# Patient Record
Sex: Female | Born: 1975 | Race: White | Hispanic: No | Marital: Married | Smoking: Former smoker
Health system: Southern US, Community
[De-identification: ages and names within clinical notes are randomized; demographics above are authoritative.]

## PROBLEM LIST (undated history)

## (undated) ENCOUNTER — Inpatient Hospital Stay (HOSPITAL_COMMUNITY): Payer: Self-pay

## (undated) ENCOUNTER — Inpatient Hospital Stay (HOSPITAL_COMMUNITY): Admission: RE | Payer: BC Managed Care – PPO | Source: Ambulatory Visit

## (undated) DIAGNOSIS — E785 Hyperlipidemia, unspecified: Secondary | ICD-10-CM

## (undated) DIAGNOSIS — N390 Urinary tract infection, site not specified: Secondary | ICD-10-CM

## (undated) DIAGNOSIS — R87619 Unspecified abnormal cytological findings in specimens from cervix uteri: Secondary | ICD-10-CM

## (undated) DIAGNOSIS — IMO0002 Reserved for concepts with insufficient information to code with codable children: Secondary | ICD-10-CM

## (undated) DIAGNOSIS — F329 Major depressive disorder, single episode, unspecified: Secondary | ICD-10-CM

## (undated) DIAGNOSIS — F32A Depression, unspecified: Secondary | ICD-10-CM

## (undated) DIAGNOSIS — N200 Calculus of kidney: Secondary | ICD-10-CM

## (undated) DIAGNOSIS — Z8619 Personal history of other infectious and parasitic diseases: Secondary | ICD-10-CM

## (undated) HISTORY — DX: Depression, unspecified: F32.A

## (undated) HISTORY — DX: Unspecified abnormal cytological findings in specimens from cervix uteri: R87.619

## (undated) HISTORY — DX: Urinary tract infection, site not specified: N39.0

## (undated) HISTORY — PX: KIDNEY STONE SURGERY: SHX686

## (undated) HISTORY — PX: HYSTERECTOMY ABDOMINAL WITH SALPINGECTOMY: SHX6725

## (undated) HISTORY — DX: Personal history of other infectious and parasitic diseases: Z86.19

## (undated) HISTORY — PX: DILATION AND CURETTAGE OF UTERUS: SHX78

## (undated) HISTORY — DX: Reserved for concepts with insufficient information to code with codable children: IMO0002

## (undated) HISTORY — DX: Hyperlipidemia, unspecified: E78.5

## (undated) HISTORY — DX: Calculus of kidney: N20.0

## (undated) HISTORY — PX: LEEP: SHX91

## (undated) HISTORY — DX: Major depressive disorder, single episode, unspecified: F32.9

---

## 1998-04-16 ENCOUNTER — Encounter: Admission: RE | Admit: 1998-04-16 | Discharge: 1998-04-16 | Payer: Self-pay | Admitting: Family Medicine

## 1999-05-25 ENCOUNTER — Emergency Department (HOSPITAL_COMMUNITY): Admission: EM | Admit: 1999-05-25 | Discharge: 1999-05-25 | Payer: Self-pay

## 2002-05-22 ENCOUNTER — Encounter: Payer: Self-pay | Admitting: Emergency Medicine

## 2002-05-22 ENCOUNTER — Emergency Department (HOSPITAL_COMMUNITY): Admission: EM | Admit: 2002-05-22 | Discharge: 2002-05-22 | Payer: Self-pay | Admitting: Emergency Medicine

## 2002-05-24 ENCOUNTER — Ambulatory Visit (HOSPITAL_BASED_OUTPATIENT_CLINIC_OR_DEPARTMENT_OTHER): Admission: RE | Admit: 2002-05-24 | Discharge: 2002-05-24 | Payer: Self-pay | Admitting: Urology

## 2002-08-16 ENCOUNTER — Ambulatory Visit (HOSPITAL_COMMUNITY): Admission: RE | Admit: 2002-08-16 | Discharge: 2002-08-16 | Payer: Self-pay | Admitting: Urology

## 2002-08-16 ENCOUNTER — Encounter: Payer: Self-pay | Admitting: Urology

## 2004-02-11 ENCOUNTER — Encounter: Payer: Self-pay | Admitting: Emergency Medicine

## 2004-02-11 ENCOUNTER — Ambulatory Visit (HOSPITAL_COMMUNITY): Admission: AD | Admit: 2004-02-11 | Discharge: 2004-02-11 | Payer: Self-pay | Admitting: Urology

## 2008-02-04 ENCOUNTER — Encounter: Payer: Self-pay | Admitting: Occupational Medicine

## 2008-02-04 ENCOUNTER — Ambulatory Visit: Payer: Self-pay | Admitting: Occupational Medicine

## 2009-02-04 ENCOUNTER — Encounter: Admission: RE | Admit: 2009-02-04 | Discharge: 2009-02-04 | Payer: Self-pay | Admitting: Obstetrics and Gynecology

## 2009-08-13 ENCOUNTER — Ambulatory Visit (HOSPITAL_COMMUNITY): Admission: RE | Admit: 2009-08-13 | Discharge: 2009-08-13 | Payer: Self-pay | Admitting: Obstetrics and Gynecology

## 2009-09-02 DEATH — deceased

## 2010-03-08 ENCOUNTER — Ambulatory Visit (HOSPITAL_COMMUNITY)
Admission: RE | Admit: 2010-03-08 | Discharge: 2010-03-08 | Payer: Self-pay | Source: Home / Self Care | Admitting: Obstetrics and Gynecology

## 2010-05-12 ENCOUNTER — Inpatient Hospital Stay (HOSPITAL_COMMUNITY)
Admission: AD | Admit: 2010-05-12 | Discharge: 2010-05-12 | Disposition: A | Discharge status: Home / Self Care | Payer: BC Managed Care – PPO | Source: Ambulatory Visit | Attending: Obstetrics and Gynecology | Admitting: Obstetrics and Gynecology

## 2010-05-12 DIAGNOSIS — O4700 False labor before 37 completed weeks of gestation, unspecified trimester: Secondary | ICD-10-CM | POA: Insufficient documentation

## 2010-06-22 LAB — ABO/RH: ABO/RH(D): A POS

## 2010-06-22 LAB — CBC
MCHC: 35.7 g/dL (ref 30.0–36.0)
RDW: 12.5 % (ref 11.5–15.5)

## 2010-07-06 ENCOUNTER — Inpatient Hospital Stay (HOSPITAL_COMMUNITY)
Admission: AD | Admit: 2010-07-06 | Discharge: 2010-07-06 | Disposition: A | Discharge status: Home / Self Care | Payer: BC Managed Care – PPO | Source: Ambulatory Visit | Attending: Obstetrics & Gynecology | Admitting: Obstetrics & Gynecology

## 2010-07-06 DIAGNOSIS — O4700 False labor before 37 completed weeks of gestation, unspecified trimester: Secondary | ICD-10-CM | POA: Insufficient documentation

## 2010-07-07 ENCOUNTER — Other Ambulatory Visit (HOSPITAL_COMMUNITY): Payer: Self-pay | Admitting: Obstetrics and Gynecology

## 2010-07-07 DIAGNOSIS — O409XX0 Polyhydramnios, unspecified trimester, not applicable or unspecified: Secondary | ICD-10-CM

## 2010-07-13 ENCOUNTER — Ambulatory Visit (HOSPITAL_COMMUNITY)
Admission: RE | Admit: 2010-07-13 | Discharge: 2010-07-13 | Disposition: A | Discharge status: Home / Self Care | Payer: BC Managed Care – PPO | Source: Ambulatory Visit | Attending: Obstetrics and Gynecology | Admitting: Obstetrics and Gynecology

## 2010-07-13 ENCOUNTER — Other Ambulatory Visit (HOSPITAL_COMMUNITY): Payer: Self-pay | Admitting: Obstetrics and Gynecology

## 2010-07-13 DIAGNOSIS — O409XX0 Polyhydramnios, unspecified trimester, not applicable or unspecified: Secondary | ICD-10-CM

## 2010-07-13 DIAGNOSIS — Z1389 Encounter for screening for other disorder: Secondary | ICD-10-CM | POA: Insufficient documentation

## 2010-07-13 DIAGNOSIS — O3660X0 Maternal care for excessive fetal growth, unspecified trimester, not applicable or unspecified: Secondary | ICD-10-CM | POA: Insufficient documentation

## 2010-07-13 DIAGNOSIS — O358XX0 Maternal care for other (suspected) fetal abnormality and damage, not applicable or unspecified: Secondary | ICD-10-CM | POA: Insufficient documentation

## 2010-07-13 DIAGNOSIS — Z363 Encounter for antenatal screening for malformations: Secondary | ICD-10-CM | POA: Insufficient documentation

## 2010-07-25 ENCOUNTER — Inpatient Hospital Stay (HOSPITAL_COMMUNITY)
Admission: AD | Admit: 2010-07-25 | Discharge: 2010-07-25 | Disposition: A | Discharge status: Home / Self Care | Payer: BC Managed Care – PPO | Source: Ambulatory Visit | Attending: Obstetrics and Gynecology | Admitting: Obstetrics and Gynecology

## 2010-07-25 ENCOUNTER — Inpatient Hospital Stay (HOSPITAL_COMMUNITY): Payer: BC Managed Care – PPO

## 2010-07-25 DIAGNOSIS — O99891 Other specified diseases and conditions complicating pregnancy: Secondary | ICD-10-CM | POA: Insufficient documentation

## 2010-07-25 DIAGNOSIS — O479 False labor, unspecified: Secondary | ICD-10-CM | POA: Insufficient documentation

## 2010-07-27 ENCOUNTER — Inpatient Hospital Stay (HOSPITAL_COMMUNITY)
Admission: RE | Admit: 2010-07-27 | Discharge: 2010-07-29 | DRG: 373 | Disposition: A | Discharge status: Home / Self Care | Payer: BC Managed Care – PPO | Source: Ambulatory Visit | Attending: Obstetrics & Gynecology | Admitting: Obstetrics & Gynecology

## 2010-07-27 DIAGNOSIS — O409XX0 Polyhydramnios, unspecified trimester, not applicable or unspecified: Principal | ICD-10-CM | POA: Diagnosis present

## 2010-07-27 LAB — CBC
MCV: 90.6 fL (ref 78.0–100.0)
Platelets: 164 10*3/uL (ref 150–400)
RDW: 15.8 % — ABNORMAL HIGH (ref 11.5–15.5)
WBC: 8.9 10*3/uL (ref 4.0–10.5)

## 2010-07-28 LAB — CBC
MCH: 29.4 pg (ref 26.0–34.0)
Platelets: 157 10*3/uL (ref 150–400)
RBC: 3.71 MIL/uL — ABNORMAL LOW (ref 3.87–5.11)
RDW: 15.7 % — ABNORMAL HIGH (ref 11.5–15.5)
WBC: 12.3 10*3/uL — ABNORMAL HIGH (ref 4.0–10.5)

## 2010-08-20 NOTE — Op Note (Signed)
NAMEDEBORRAH, [name] NO.:  [id number]   MEDICAL RECORD NO.:  [id number]          PATIENT TYPE:  AMB   LOCATION:  DAY                          FACILITY:  [facility]   PHYSICIAN:  Jamison Neighbor, M.D.  DATE OF BIRTH:  1975/08/26   DATE OF PROCEDURE:  02/11/2004  DATE OF DISCHARGE:                                 OPERATIVE REPORT   SERVICE:  Urology.   PREOPERATIVE DIAGNOSES:  Left ureteral calculus.   POSTOPERATIVE DIAGNOSES:  Left ureteral calculus.   PROCEDURE:  Cystoscopy, left retrograde left ureteroscopy, left basket  extraction.   SURGEON:  Jamison Neighbor, M.D.   ANESTHESIA:  General.   COMPLICATIONS:  None.   DRAINS:  None.   BRIEF HISTORY:  This patient has had multiple stones in the past.  She has  primarily had problems on the right hand side.  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  The patient now has  left sided pain and CT scan showed that there was a small stone. The patient  felt the pain was still intractable and she required so much medication that  she wished to have something done about this. She is now to undergo stent  placement with possible ureteroscopy.  The patient understands the risks and  benefits of the procedure and gave full and informed consent.   DESCRIPTION OF PROCEDURE:  After successful induction of general anesthesia,  the patient was placed in the dorsal lithotomy position, prepped with  Betadine and draped in the usual sterile fashion.  Cystoscopy was performed,  the bladder was carefully inspected and was [place] of any tumor or stones.  Both ureteral orifices were normal in configuration and location. Retrograde  study performed on the left hand side showed moderately dilated ureter with  possible stone in the distal left ureter. The patient underwent  ureteroscopic examination where a stone could be seen, this was extracted  under direct vision.  The remainder of the ureter  was completely normal.  This was such an atraumatic ureteroscopy with no need for dilation and it  was felt that a stent was not required.  The guidewire was removed, the  patient tolerated the procedure well and was taken to the recovery room in  good condition. She will be sent home with Tylox and will return to see [place]  in followup.     RJE/MEDQ  D:  02/11/2004  T:  02/11/2004  Job:  [id number]

## 2010-08-20 NOTE — Op Note (Signed)
[name], [name]                            ACCOUNT NO.:  [id number]   MEDICAL RECORD NO.:  [id number]                   PATIENT TYPE:  AMB   LOCATION:  [facility]                                 FACILITY:  [facility]   PHYSICIAN:  Jamison Neighbor, M.D.               DATE OF BIRTH:  02/22/1976   DATE OF PROCEDURE:  05/24/2002  DATE OF DISCHARGE:                                 OPERATIVE REPORT   PREOPERATIVE DIAGNOSIS:  Right hydronephrosis.   POSTOPERATIVE DIAGNOSIS:  Right hydronephrosis.   PROCEDURES:  1. Cystoscopy.  2. Bilateral retrograde pyelography.  3. Right ureteroscopy with balloon dilation of ureter.  4. Right ureteral stent placement.   SURGEON:  Jamison Neighbor, M.D.   ASSISTANT:  Crecencio Mc, M.D.   ANESTHESIA:  General.   COMPLICATIONS:  None.   INDICATIONS FOR PROCEDURE:  The patient is a 35 year-old white female with  recent onset of right sided abdominal and flank pain.  She is status post a  right ureteroscopic stone procedure a couple of years ago at an outside  hospital.  The patient recently presented with her symptoms and a CT scan  was performed.  This demonstrated a dilated renal pelvis with dilation of  the ureter down to the pelvis.  In addition, there was a large calcification  near the area where the dilation appeared to disappear.  This did appear to  be outside the ureter consistent with a possible appendicolith.  However,  due to the patient's symptoms and dilated system, it was decided to perform  retrograde pyelography for further evaluation with ureteroscopy.  The  potential risks and benefits of this procedure were explained to the patient  and she consented.   DESCRIPTION OF PROCEDURE:  The patient was taken to the operating room and  general anesthetic was administered.  The patient was given preoperative  antibiotics, placed in the dorsal lithotomy position and prepped and draped  in the usual sterile fashion.  Next, cystoscopy was  performed within a 12  degree lens.  The entire bladder was able to be examined and demonstrated  bilateral ureteral orifices in the normal anatomic position and effluxing  clear urine. There was no evidence of any tumors, stones or other mucosal  pathology.  There was only a small amount of squamous metaplasia over the  trigone. Attention was then turned to the right ureteral orifice. A  retrograde pyelogram was performed.  This demonstrated a dilated ureter from  the bladder all the way up to a dilated renal pelvis.  There were two areas  in the ureter that appeared kinked.  One of these was just above the pelvic  brim. There was another area just below the ureteropelvic junction. Although  there was a large dilated renal pelvis, the calices did not appear blunted  and dilated.  At this point, a 0.038 guide wire was placed up through  the 6  [place] Inoue catheter.  There was some difficulty getting the wire to  traverse the upper kink in the ureter.  Therefore, the Inoue catheter was  advanced up over this wire and the wire was then able to be placed into the  renal pelvis without problems.  Ureteroscopy was performed with the semi-  rigid ureteroscope and demonstrated no intraluminal pathology or clear areas  of intraluminal obstruction.  The ureteroscope was then removed and replaced  with the cystoscope which was back loaded over the guide wire.  Balloon  dilation was then performed first of the upper ureter and then of the mid  and lower ureter.  A 7 x 0.6 double J ureteral stent was then placed over  the guide wire using Seldinger technique.  A good curl was noted up in the  renal pelvis as well as in the bladder following removal of the wire.  The  patient's bladder was then emptied and the procedure was ended. She was able  to be awakened and transferred to the recovery unit in satisfactory  condition.  Please note that Dr. Marcelyn Bruins was the operating surgeon and  was present and  participated in the entire procedure.     Crecencio Mc, M.D.                          Jamison Neighbor, M.D.    LB/MEDQ  D:  05/24/2002  T:  05/24/2002  Job:  [id number]   cc:   Jamison Neighbor, M.D.  [address]  [place]  [place] [address]  Fax: [phone]

## 2011-09-30 LAB — OB RESULTS CONSOLE ABO/RH: RH Type: POSITIVE

## 2011-09-30 LAB — OB RESULTS CONSOLE HEPATITIS B SURFACE ANTIGEN: Hepatitis B Surface Ag: NEGATIVE

## 2011-09-30 LAB — OB RESULTS CONSOLE ANTIBODY SCREEN: Antibody Screen: NEGATIVE

## 2011-09-30 LAB — OB RESULTS CONSOLE RUBELLA ANTIBODY, IGM: Rubella: IMMUNE

## 2012-02-17 ENCOUNTER — Inpatient Hospital Stay (HOSPITAL_COMMUNITY)
Admission: AD | Admit: 2012-02-17 | Discharge: 2012-02-17 | Disposition: A | Discharge status: Home / Self Care | Payer: BC Managed Care – PPO | Source: Ambulatory Visit | Attending: Obstetrics and Gynecology | Admitting: Obstetrics and Gynecology

## 2012-02-17 ENCOUNTER — Encounter (HOSPITAL_COMMUNITY): Payer: Self-pay | Admitting: *Deleted

## 2012-02-17 DIAGNOSIS — Y92410 Unspecified street and highway as the place of occurrence of the external cause: Secondary | ICD-10-CM | POA: Diagnosis not present

## 2012-02-17 DIAGNOSIS — O99891 Other specified diseases and conditions complicating pregnancy: Secondary | ICD-10-CM | POA: Insufficient documentation

## 2012-02-17 DIAGNOSIS — M549 Dorsalgia, unspecified: Secondary | ICD-10-CM | POA: Diagnosis present
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
## 2012-03-25 ENCOUNTER — Inpatient Hospital Stay (HOSPITAL_COMMUNITY)
Admission: AD | Admit: 2012-03-25 | Discharge: 2012-03-25 | Disposition: A | Discharge status: Home / Self Care | Payer: BC Managed Care – PPO | Source: Ambulatory Visit | Attending: Obstetrics and Gynecology | Admitting: Obstetrics and Gynecology

## 2012-03-25 ENCOUNTER — Encounter (HOSPITAL_COMMUNITY): Payer: Self-pay | Admitting: Obstetrics and Gynecology

## 2012-03-25 DIAGNOSIS — O4700 False labor before 37 completed weeks of gestation, unspecified trimester: Secondary | ICD-10-CM | POA: Insufficient documentation

## 2012-03-25 LAB — URINALYSIS, ROUTINE W REFLEX MICROSCOPIC
Ketones, ur: 15 mg/dL — AB
Nitrite: NEGATIVE
Urobilinogen, UA: 0.2 mg/dL (ref 0.0–1.0)
pH: 6 (ref 5.0–8.0)

## 2012-03-25 LAB — CBC
HCT: 34.3 % — ABNORMAL LOW (ref 36.0–46.0)
Hemoglobin: 11.6 g/dL — ABNORMAL LOW (ref 12.0–15.0)
RBC: 3.82 MIL/uL — ABNORMAL LOW (ref 3.87–5.11)
WBC: 11 10*3/uL — ABNORMAL HIGH (ref 4.0–10.5)

## 2012-03-25 LAB — WET PREP, GENITAL: Trich, Wet Prep: NONE SEEN

## 2012-03-25 LAB — URINE MICROSCOPIC-ADD ON

## 2012-03-25 MED ORDER — LACTATED RINGERS IV BOLUS (SEPSIS): 1000.0000 mL | Freq: Once | INTRAVENOUS | Status: DC

## 2012-03-25 MED ORDER — BETAMETHASONE SOD PHOS & ACET 6 (3-3) MG/ML IJ SUSP
12.0000 mg | Freq: Once | INTRAMUSCULAR | Status: AC
  Administered 2012-03-25: 12 mg via INTRAMUSCULAR
  Filled 2012-03-25: qty 2

## 2012-03-25 MED ORDER — NIFEDIPINE 10 MG PO CAPS
20.0000 mg | ORAL_CAPSULE | Freq: Once | ORAL | Status: AC
  Administered 2012-03-25: 20 mg via ORAL
  Filled 2012-03-25: qty 2

## 2012-03-25 MED ORDER — NIFEDIPINE 10 MG PO CAPS: 10.0000 mg | ORAL_CAPSULE | Freq: Three times a day (TID) | ORAL | Status: DC

## 2012-03-25 NOTE — MAU Note (Signed)
Pt reports having ctx on and off since 1040. Reports having good fetal movement denies Vag bleeding or leaking.

## 2012-03-25 NOTE — ED Provider Notes (Signed)
Pt is a 36 year old white female [phone] who presents to the ER complaining of increased cramping and lower abd pressure since this am. She states that she had an episode of diarrhea this am and "just did not feel right". Early this evening she had an increase in contractions. In the ER she was contracting every two to three minutes. Her cervix was 20/1/high. She had a FFN obtained which was negative. She was given Procardia and IVFs. She was also given Betamethasone. She had a normal WBC, After the therapy she noted less pressure and could not feel any cramping. Plan/ Will discharge to home with bedrest.          Will return tomorrow for the second Betamethasone          Will use procardia 10mg  QID.

## 2012-03-25 NOTE — MAU Provider Note (Signed)
Chief Complaint:  Contractions   First Provider Initiated Contact with Patient 03/25/12 1543      HPI: [name] is a 36 y.o. [id number] at [redacted]w[redacted]d who presents to maternity admissions reporting contractions. The patient states that contractions started this morning and were irregular and now have been occuring approximately every 5 minutes x 1 hour. The patient states that she does have some vaginal discharge but there is not change from previous. She denies LOF, vaginal bleeding. She reports good fetal movement. She denies any urinary symptoms and states that she has had good PO intake today. She does report 1 episode of loose stool last night that has since resolved.   Pregnancy Course:   Past Medical History: Past Medical History  Diagnosis Date  . No pertinent past medical history     Past obstetric history:     OB History    Grav Para Term Preterm Abortions TAB SAB Ect Mult Living   4 2 2  1  1   2      # Outc Date GA Lbr Len/2nd Wgt Sex Del Anes PTL Lv   1 TRM            2 TRM            3 SAB            4 CUR               Past Surgical History: Past Surgical History  Procedure Date  . Kidney stone surgery     Family History: History reviewed. No pertinent family history.  Social History: History  Substance Use Topics  . Smoking status: Never Smoker   . Smokeless tobacco: Not on file  . Alcohol Use: No    Allergies:  Allergies  Allergen Reactions  . Codeine Sulfate Other (See Comments)    REACTION: hallucinations     Meds:  Prescriptions prior to admission  Medication Sig Dispense Refill  . Prenatal Vit-Fe Fumarate-FA (PRENATAL MULTIVITAMIN) TABS Take 1 tablet by mouth daily.        ROS: Pertinent findings in history of present illness.  Physical Exam  Blood pressure 109/65, pulse 119, temperature 98 F (36.7 C), temperature source Oral, resp. rate 18, height 5\' 5"  (1.651 m), weight 169 lb 3.2 oz (76.749 kg). GENERAL: Well-developed, well-nourished  female in no acute distress.  HEENT: normocephalic HEART: normal rate RESP: normal effort ABDOMEN: Soft, non-tender, gravid appropriate for gestational age EXTREMITIES: Nontender, no edema NEURO: alert and oriented SPECULUM EXAM: NEFG, physiologic discharge, no blood, cervix clean    FHT:  Baseline 160 , moderate variability, accelerations present, no decelerations Contractions: q 3-4 mins   Labs: Results for orders placed during the hospital encounter of 03/25/12 (from the past 24 hour(s))  URINALYSIS, ROUTINE W REFLEX MICROSCOPIC     Status: Abnormal   Collection Time   03/25/12  3:15 PM      Component Value Range   Color, Urine YELLOW  YELLOW   APPearance CLEAR  CLEAR   Specific Gravity, Urine 1.010  1.005 - 1.030   pH 6.0  5.0 - 8.0   Glucose, UA NEGATIVE  NEGATIVE mg/dL   Hgb urine dipstick NEGATIVE  NEGATIVE   Bilirubin Urine NEGATIVE  NEGATIVE   Ketones, ur 15 (*) NEGATIVE mg/dL   Protein, ur NEGATIVE  NEGATIVE mg/dL   Urobilinogen, UA 0.2  0.0 - 1.0 mg/dL   Nitrite NEGATIVE  NEGATIVE   Leukocytes, UA SMALL (*) NEGATIVE  URINE MICROSCOPIC-ADD ON     Status: Abnormal   Collection Time   03/25/12  3:15 PM      Component Value Range   Squamous Epithelial / LPF FEW (*) RARE   WBC, UA 3-6  <3 WBC/hpf   RBC / HPF 0-2  <3 RBC/hpf   Bacteria, UA FEW (*) RARE   Urine-Other RARE YEAST      Imaging:  No results found.  MAU Course: Discussed patient with Dr. Dareen Piano. He would like patient to have procardia and betamethasone now and continue to monitor for contractions Procardia 20 mg given. Patient continues to contract every 3-4 minutes. Contractions are subjectively worse.  Dr. Dareen Piano came to evaluate the patient.  IV started and given LR bolus.   Assessment: Preterm contractions  Plan: Dr. Dareen Piano to MAU to evaluate patient. Care of patient handed over to Dr. Dareen Piano.     Medication List     As of 03/25/2012  3:58 PM    ASK your doctor about these  medications         prenatal multivitamin Tabs   Take 1 tablet by mouth daily.          Freddi Starr, PA-C 03/25/2012 3:58 PM

## 2012-03-25 NOTE — MAU Note (Signed)
Pt presents to MAU with chief complaint of contractions. Pt is [redacted]w[redacted]d- says she was at church this morning and contractions started. Pt called Dr. Dareen Piano and he told her to rest and come to MAU if contractions did not stop. Pt says the contractions have not stopped since this morning.

## 2012-03-26 ENCOUNTER — Inpatient Hospital Stay (HOSPITAL_COMMUNITY)
Admission: AD | Admit: 2012-03-26 | Discharge: 2012-03-26 | Disposition: A | Discharge status: Home / Self Care | Payer: BC Managed Care – PPO | Source: Ambulatory Visit | Attending: Obstetrics & Gynecology | Admitting: Obstetrics & Gynecology

## 2012-03-26 DIAGNOSIS — O4700 False labor before 37 completed weeks of gestation, unspecified trimester: Secondary | ICD-10-CM | POA: Insufficient documentation

## 2012-03-26 LAB — GC/CHLAMYDIA PROBE AMP
CT Probe RNA: NEGATIVE
GC Probe RNA: NEGATIVE

## 2012-03-26 MED ORDER — BETAMETHASONE SOD PHOS & ACET 6 (3-3) MG/ML IJ SUSP
12.0000 mg | Freq: Once | INTRAMUSCULAR | Status: AC
  Administered 2012-03-26: 12 mg via INTRAMUSCULAR
  Filled 2012-03-26: qty 2

## 2012-03-27 LAB — URINE CULTURE
Colony Count: NO GROWTH
Culture: NO GROWTH
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
## 2012-04-11 ENCOUNTER — Encounter (HOSPITAL_COMMUNITY): Payer: Self-pay | Admitting: *Deleted

## 2012-04-11 ENCOUNTER — Inpatient Hospital Stay (HOSPITAL_COMMUNITY)
Admission: AD | Admit: 2012-04-11 | Discharge: 2012-04-11 | Disposition: A | Discharge status: Home / Self Care | Payer: BC Managed Care – PPO | Source: Ambulatory Visit | Attending: Obstetrics and Gynecology | Admitting: Obstetrics and Gynecology

## 2012-04-11 DIAGNOSIS — O4700 False labor before 37 completed weeks of gestation, unspecified trimester: Secondary | ICD-10-CM | POA: Insufficient documentation

## 2012-04-11 LAB — CBC
MCH: 30.7 pg (ref 26.0–34.0)
MCHC: 34.6 g/dL (ref 30.0–36.0)
MCV: 88.8 fL (ref 78.0–100.0)
Platelets: 214 10*3/uL (ref 150–400)
RBC: 4.01 MIL/uL (ref 3.87–5.11)

## 2012-04-11 LAB — URINALYSIS, ROUTINE W REFLEX MICROSCOPIC
Bilirubin Urine: NEGATIVE
Ketones, ur: NEGATIVE mg/dL
Nitrite: NEGATIVE
Specific Gravity, Urine: 1.02 (ref 1.005–1.030)
Urobilinogen, UA: 0.2 mg/dL (ref 0.0–1.0)

## 2012-04-11 LAB — COMPREHENSIVE METABOLIC PANEL
AST: 12 U/L (ref 0–37)
CO2: 20 mEq/L (ref 19–32)
Calcium: 9 mg/dL (ref 8.4–10.5)
Creatinine, Ser: 0.61 mg/dL (ref 0.50–1.10)
GFR calc Af Amer: >90 mL/min (ref >90)
GFR calc non Af Amer: >90 mL/min (ref >90)
Total Protein: 6.4 g/dL (ref 6.0–8.3)

## 2012-04-11 NOTE — Progress Notes (Signed)
Pt states it depends, but with the worse contractions pain is about a 6

## 2012-04-11 NOTE — MAU Provider Note (Signed)
History     CSN: [id number]  Arrival date and time: 04/11/12 1659   None     Chief Complaint  Patient presents with  . Contractions    Pt sent over from the doctors office for further evaluation   HPI This is a 37 y.o. female at [redacted]w[redacted]d who presents from office with contractions. Also had been having some nausea which she believes was related to her low back pain, but is now gone. Denies congestion or sore throat. Has painful contractions which she reports as a "6".  Denies leaking or bleeding. Dr Tenny Craw saw her in the office and sent her over for monitoring, labs and a flu test.   OB History    Grav Para Term Preterm Abortions TAB SAB Ect Mult Living   4 2 2  1  1   2       Past Medical History  Diagnosis Date  . No pertinent past medical history     Past Surgical History  Procedure Date  . Kidney stone surgery     Family History  Problem Relation Age of Onset  . Diabetes Father     History  Substance Use Topics  . Smoking status: Never Smoker   . Smokeless tobacco: Not on file  . Alcohol Use: No    Allergies:  Allergies  Allergen Reactions  . Codeine Sulfate Other (See Comments)    REACTION: hallucinations     Prescriptions prior to admission  Medication Sig Dispense Refill  . Prenatal Vit-Fe Fumarate-FA (PRENATAL MULTIVITAMIN) TABS Take 1 tablet by mouth daily.        Review of Systems  Constitutional: Negative for fever, chills and malaise/fatigue.  HENT: Negative for congestion and sore throat.   Eyes: Negative for blurred vision.  Respiratory: Negative for cough and shortness of breath.   Cardiovascular: Negative for chest pain.  Gastrointestinal: Positive for abdominal pain (with contractions). Negative for heartburn, nausea, vomiting, diarrhea and constipation.  Genitourinary: Negative for dysuria.  Musculoskeletal: Negative for myalgias.  Neurological: Negative for weakness and headaches.   Physical Exam   Blood pressure 110/71, pulse 112,  temperature 97.9 F (36.6 C), temperature source Oral, resp. rate 18.  Physical Exam  Constitutional: She is oriented to person, place, and time. She appears well-developed and well-nourished. No distress.  HENT:  Head: Normocephalic.  Cardiovascular: Normal rate, regular rhythm and normal heart sounds.   No murmur heard. Respiratory: Effort normal and breath sounds normal. No respiratory distress. She has no wheezes. She has no rales. She exhibits no tenderness.  GI: Soft. She exhibits no distension and no mass. There is no tenderness. There is no rebound and no guarding.  Musculoskeletal: Normal range of motion. She exhibits no edema.  Neurological: She is alert and oriented to person, place, and time.  Skin: Skin is warm and dry. She is not diaphoretic.  Psychiatric: She has a normal mood and affect.   FHR baseline 140-150 with accels to 160-170 UCs every 3-4 minutes.  Dilation: 3 Effacement (%): 50 Cervical Position: Middle Station: -2 Presentation: Vertex Exam by:: B.Bethea RN  Results for orders placed during the hospital encounter of 04/11/12 (from the past 24 hour(s))  URINALYSIS, ROUTINE W REFLEX MICROSCOPIC     Status: Abnormal   Collection Time   04/11/12  5:00 PM      Component Value Range   Color, Urine YELLOW  YELLOW   APPearance CLEAR  CLEAR   Specific Gravity, Urine 1.020  1.005 - 1.030  pH 6.0  5.0 - 8.0   Glucose, UA NEGATIVE  NEGATIVE mg/dL   Hgb urine dipstick TRACE (*) NEGATIVE   Bilirubin Urine NEGATIVE  NEGATIVE   Ketones, ur NEGATIVE  NEGATIVE mg/dL   Protein, ur NEGATIVE  NEGATIVE mg/dL   Urobilinogen, UA 0.2  0.0 - 1.0 mg/dL   Nitrite NEGATIVE  NEGATIVE   Leukocytes, UA TRACE (*) NEGATIVE  URINE MICROSCOPIC-ADD ON     Status: Abnormal   Collection Time   04/11/12  5:00 PM      Component Value Range   Squamous Epithelial / LPF MANY (*) RARE   WBC, UA 7-10  <3 WBC/hpf   RBC / HPF 3-6  <3 RBC/hpf   Bacteria, UA MANY (*) RARE   Urine-Other MUCOUS  PRESENT    CBC     Status: Abnormal   Collection Time   04/11/12  5:23 PM      Component Value Range   WBC 11.3 (*) 4.0 - 10.5 K/uL   RBC 4.01  3.87 - 5.11 MIL/uL   Hemoglobin 12.3  12.0 - 15.0 g/dL   HCT 57.8 (*) 46.9 - 62.9 %   MCV 88.8  78.0 - 100.0 fL   MCH 30.7  26.0 - 34.0 pg   MCHC 34.6  30.0 - 36.0 g/dL   RDW 52.8  41.3 - 24.4 %   Platelets 214  150 - 400 K/uL  COMPREHENSIVE METABOLIC PANEL     Status: Abnormal   Collection Time   04/11/12  5:23 PM      Component Value Range   Sodium 134 (*) 135 - 145 mEq/L   Potassium 3.7  3.5 - 5.1 mEq/L   Chloride 101  96 - 112 mEq/L   CO2 20  19 - 32 mEq/L   Glucose, Bld 80  70 - 99 mg/dL   BUN 6  6 - 23 mg/dL   Creatinine, Ser 0.10  0.50 - 1.10 mg/dL   Calcium 9.0  8.4 - 27.2 mg/dL   Total Protein 6.4  6.0 - 8.3 g/dL   Albumin 2.8 (*) 3.5 - 5.2 g/dL   AST 12  0 - 37 U/L   ALT 8  0 - 35 U/L   Alkaline Phosphatase 95  39 - 117 U/L   Total Bilirubin 0.3  0.3 - 1.2 mg/dL   GFR calc non Af Amer >90  >90 mL/min   GFR calc Af Amer >90  >90 mL/min   Urine to culture per lab protocol  MAU Course  Procedures  MDM Discussed with Dr Dareen Piano.  Will call patient if flu test comes back positive.  Assessment and Plan  A:  SIUP at [redacted]w[redacted]d       Preterm contractions without cervical change P:  Discharge home      Supportive care      Labor precautions reviewed. Encouraged to call if symptoms recur or if contractions get stronger, leaking starts or has bleeding or decreased fetal movements.    [facility] 04/11/2012, 6:35 PM   After visit: Results for orders placed during the hospital encounter of 04/11/12 (from the past 24 hour(s))  URINALYSIS, ROUTINE W REFLEX MICROSCOPIC     Status: Abnormal   Collection Time   04/11/12  5:00 PM      Component Value Range   Color, Urine YELLOW  YELLOW   APPearance CLEAR  CLEAR   Specific Gravity, Urine 1.020  1.005 - 1.030   pH 6.0  5.0 - 8.0  Glucose, UA NEGATIVE  NEGATIVE mg/dL   Hgb urine  dipstick TRACE (*) NEGATIVE   Bilirubin Urine NEGATIVE  NEGATIVE   Ketones, ur NEGATIVE  NEGATIVE mg/dL   Protein, ur NEGATIVE  NEGATIVE mg/dL   Urobilinogen, UA 0.2  0.0 - 1.0 mg/dL   Nitrite NEGATIVE  NEGATIVE   Leukocytes, UA TRACE (*) NEGATIVE  URINE MICROSCOPIC-ADD ON     Status: Abnormal   Collection Time   04/11/12  5:00 PM      Component Value Range   Squamous Epithelial / LPF MANY (*) RARE   WBC, UA 7-10  <3 WBC/hpf   RBC / HPF 3-6  <3 RBC/hpf   Bacteria, UA MANY (*) RARE   Urine-Other MUCOUS PRESENT    T4     Status: Abnormal   Collection Time   04/11/12  5:23 PM      Component Value Range   T4, Total 14.0 (*) 5.0 - 12.5 ug/dL  TSH     Status: Normal   Collection Time   04/11/12  5:23 PM      Component Value Range   TSH 2.844  0.350 - 4.500 uIU/mL  CBC     Status: Abnormal   Collection Time   04/11/12  5:23 PM      Component Value Range   WBC 11.3 (*) 4.0 - 10.5 K/uL   RBC 4.01  3.87 - 5.11 MIL/uL   Hemoglobin 12.3  12.0 - 15.0 g/dL   HCT 16.1 (*) 09.6 - 04.5 %   MCV 88.8  78.0 - 100.0 fL   MCH 30.7  26.0 - 34.0 pg   MCHC 34.6  30.0 - 36.0 g/dL   RDW 40.9  81.1 - 91.4 %   Platelets 214  150 - 400 K/uL  COMPREHENSIVE METABOLIC PANEL     Status: Abnormal   Collection Time   04/11/12  5:23 PM      Component Value Range   Sodium 134 (*) 135 - 145 mEq/L   Potassium 3.7  3.5 - 5.1 mEq/L   Chloride 101  96 - 112 mEq/L   CO2 20  19 - 32 mEq/L   Glucose, Bld 80  70 - 99 mg/dL   BUN 6  6 - 23 mg/dL   Creatinine, Ser 7.82  0.50 - 1.10 mg/dL   Calcium 9.0  8.4 - 95.6 mg/dL   Total Protein 6.4  6.0 - 8.3 g/dL   Albumin 2.8 (*) 3.5 - 5.2 g/dL   AST 12  0 - 37 U/L   ALT 8  0 - 35 U/L   Alkaline Phosphatase 95  39 - 117 U/L   Total Bilirubin 0.3  0.3 - 1.2 mg/dL   GFR calc non Af Amer >90  >90 mL/min   GFR calc Af Amer >90  >90 mL/min  INFLUENZA PANEL BY PCR     Status: Normal   Collection Time   04/11/12  5:50 PM      Component Value Range   Influenza A By PCR NEGATIVE   NEGATIVE   Influenza B By PCR NEGATIVE  NEGATIVE   H1N1 flu by pcr NOT DETECTED  NOT DETECTED

## 2012-04-11 NOTE — MAU Note (Signed)
Pt states she was seen in the office for a regular visit. Pt states she has been having contractions "the last couple of days

## 2012-04-12 LAB — INFLUENZA PANEL BY PCR (TYPE A & B)
H1N1 flu by pcr: NOT DETECTED
Influenza B By PCR: NEGATIVE

## 2012-04-12 LAB — T4: T4, Total: 14 ug/dL — ABNORMAL HIGH (ref 5.0–12.5)

## 2012-04-12 LAB — URINE CULTURE
Colony Count: NO GROWTH
Culture: NO GROWTH

## 2012-04-30 ENCOUNTER — Encounter (HOSPITAL_COMMUNITY): Payer: Self-pay | Admitting: *Deleted

## 2012-04-30 ENCOUNTER — Telehealth (HOSPITAL_COMMUNITY): Payer: Self-pay | Admitting: *Deleted

## 2012-04-30 NOTE — Telephone Encounter (Signed)
Preadmission screen  

## 2012-05-04 ENCOUNTER — Encounter (HOSPITAL_COMMUNITY): Payer: Self-pay | Admitting: Anesthesiology

## 2012-05-04 ENCOUNTER — Inpatient Hospital Stay (HOSPITAL_COMMUNITY): Payer: BC Managed Care – PPO | Admitting: Anesthesiology

## 2012-05-04 ENCOUNTER — Inpatient Hospital Stay (HOSPITAL_COMMUNITY)
Admission: RE | Admit: 2012-05-04 | Discharge: 2012-05-06 | DRG: 373 | Disposition: A | Discharge status: Home / Self Care | Payer: BC Managed Care – PPO | Source: Ambulatory Visit | Attending: Obstetrics and Gynecology | Admitting: Obstetrics and Gynecology

## 2012-05-04 ENCOUNTER — Encounter (HOSPITAL_COMMUNITY): Payer: Self-pay

## 2012-05-04 LAB — RPR: RPR Ser Ql: NONREACTIVE

## 2012-05-04 LAB — CBC
MCH: 29.6 pg (ref 26.0–34.0)
MCHC: 33.2 g/dL (ref 30.0–36.0)
RDW: 14.9 % (ref 11.5–15.5)

## 2012-05-04 MED ORDER — IBUPROFEN 600 MG PO TABS: 600.0000 mg | ORAL_TABLET | Freq: Four times a day (QID) | ORAL | Status: DC | PRN

## 2012-05-04 MED ORDER — TETANUS-DIPHTH-ACELL PERTUSSIS 5-2.5-18.5 LF-MCG/0.5 IM SUSP
0.5000 mL | Freq: Once | INTRAMUSCULAR | Status: AC
  Administered 2012-05-06: 0.5 mL via INTRAMUSCULAR
  Filled 2012-05-04: qty 0.5

## 2012-05-04 MED ORDER — ONDANSETRON HCL 4 MG/2ML IJ SOLN: 4.0000 mg | Freq: Four times a day (QID) | INTRAMUSCULAR | Status: DC | PRN

## 2012-05-04 MED ORDER — PHENYLEPHRINE 40 MCG/ML (10ML) SYRINGE FOR IV PUSH (FOR BLOOD PRESSURE SUPPORT)
80.0000 ug | PREFILLED_SYRINGE | INTRAVENOUS | Status: DC | PRN
  Administered 2012-05-04 (×2): 80 ug via INTRAVENOUS

## 2012-05-04 MED ORDER — LANOLIN HYDROUS EX OINT: TOPICAL_OINTMENT | CUTANEOUS | Status: DC | PRN

## 2012-05-04 MED ORDER — LACTATED RINGERS IV SOLN
500.0000 mL | INTRAVENOUS | Status: DC | PRN
  Administered 2012-05-04: 1000 mL via INTRAVENOUS

## 2012-05-04 MED ORDER — CITRIC ACID-SODIUM CITRATE 334-500 MG/5ML PO SOLN: 30.0000 mL | ORAL | Status: DC | PRN

## 2012-05-04 MED ORDER — EPHEDRINE 5 MG/ML INJ
10.0000 mg | INTRAVENOUS | Status: DC | PRN
  Filled 2012-05-04: qty 4

## 2012-05-04 MED ORDER — BENZOCAINE-MENTHOL 20-0.5 % EX AERO: 1.0000 "application " | INHALATION_SPRAY | CUTANEOUS | Status: DC | PRN

## 2012-05-04 MED ORDER — ONDANSETRON HCL 4 MG PO TABS: 4.0000 mg | ORAL_TABLET | ORAL | Status: DC | PRN

## 2012-05-04 MED ORDER — LACTATED RINGERS IV SOLN
INTRAVENOUS | Status: DC
  Administered 2012-05-04 (×2): 1000 mL via INTRAVENOUS

## 2012-05-04 MED ORDER — SENNOSIDES-DOCUSATE SODIUM 8.6-50 MG PO TABS
2.0000 | ORAL_TABLET | Freq: Every day | ORAL | Status: DC
  Administered 2012-05-05 (×2): 2 via ORAL

## 2012-05-04 MED ORDER — DIPHENHYDRAMINE HCL 25 MG PO CAPS: 25.0000 mg | ORAL_CAPSULE | Freq: Four times a day (QID) | ORAL | Status: DC | PRN

## 2012-05-04 MED ORDER — ACETAMINOPHEN 325 MG PO TABS: 650.0000 mg | ORAL_TABLET | ORAL | Status: DC | PRN

## 2012-05-04 MED ORDER — OXYCODONE-ACETAMINOPHEN 5-325 MG PO TABS: 1.0000 | ORAL_TABLET | ORAL | Status: DC | PRN

## 2012-05-04 MED ORDER — FLEET ENEMA 7-19 GM/118ML RE ENEM: 1.0000 | ENEMA | RECTAL | Status: DC | PRN

## 2012-05-04 MED ORDER — FENTANYL 2.5 MCG/ML BUPIVACAINE 1/10 % EPIDURAL INFUSION (WH - ANES)
14.0000 mL/h | INTRAMUSCULAR | Status: DC
  Filled 2012-05-04: qty 125

## 2012-05-04 MED ORDER — SIMETHICONE 80 MG PO CHEW: 80.0000 mg | CHEWABLE_TABLET | ORAL | Status: DC | PRN

## 2012-05-04 MED ORDER — OXYTOCIN 40 UNITS IN LACTATED RINGERS INFUSION - SIMPLE MED
1.0000 m[IU]/min | INTRAVENOUS | Status: DC
  Administered 2012-05-04: 6 m[IU]/min via INTRAVENOUS
  Administered 2012-05-04: 2 m[IU]/min via INTRAVENOUS
  Filled 2012-05-04: qty 1000

## 2012-05-04 MED ORDER — IBUPROFEN 600 MG PO TABS
600.0000 mg | ORAL_TABLET | Freq: Four times a day (QID) | ORAL | Status: DC
  Administered 2012-05-04 – 2012-05-06 (×6): 600 mg via ORAL
  Filled 2012-05-04 (×6): qty 1

## 2012-05-04 MED ORDER — ONDANSETRON HCL 4 MG/2ML IJ SOLN: 4.0000 mg | INTRAMUSCULAR | Status: DC | PRN

## 2012-05-04 MED ORDER — LIDOCAINE HCL (PF) 1 % IJ SOLN
INTRAMUSCULAR | Status: DC | PRN
  Administered 2012-05-04 (×2): 9 mL

## 2012-05-04 MED ORDER — PRENATAL MULTIVITAMIN CH
1.0000 | ORAL_TABLET | Freq: Every day | ORAL | Status: DC
  Administered 2012-05-05 – 2012-05-06 (×3): 1 via ORAL
  Filled 2012-05-04 (×3): qty 1

## 2012-05-04 MED ORDER — OXYTOCIN BOLUS FROM INFUSION: 500.0000 mL | INTRAVENOUS | Status: DC

## 2012-05-04 MED ORDER — WITCH HAZEL-GLYCERIN EX PADS: 1.0000 "application " | MEDICATED_PAD | CUTANEOUS | Status: DC | PRN

## 2012-05-04 MED ORDER — OXYTOCIN 40 UNITS IN LACTATED RINGERS INFUSION - SIMPLE MED
62.5000 mL/h | INTRAVENOUS | Status: DC
  Administered 2012-05-04: 500 mL/h via INTRAVENOUS

## 2012-05-04 MED ORDER — TERBUTALINE SULFATE 1 MG/ML IJ SOLN: 0.2500 mg | Freq: Once | INTRAMUSCULAR | Status: DC | PRN

## 2012-05-04 MED ORDER — EPHEDRINE 5 MG/ML INJ: 10.0000 mg | INTRAVENOUS | Status: DC | PRN

## 2012-05-04 MED ORDER — FENTANYL 2.5 MCG/ML BUPIVACAINE 1/10 % EPIDURAL INFUSION (WH - ANES)
INTRAMUSCULAR | Status: DC | PRN
  Administered 2012-05-04: 14 mL/h via EPIDURAL

## 2012-05-04 MED ORDER — DIBUCAINE 1 % RE OINT: 1.0000 "application " | TOPICAL_OINTMENT | RECTAL | Status: DC | PRN

## 2012-05-04 MED ORDER — LIDOCAINE HCL (PF) 1 % IJ SOLN: 30.0000 mL | INTRAMUSCULAR | Status: DC | PRN

## 2012-05-04 MED ORDER — DIPHENHYDRAMINE HCL 50 MG/ML IJ SOLN: 12.5000 mg | INTRAMUSCULAR | Status: DC | PRN

## 2012-05-04 MED ORDER — LACTATED RINGERS IV SOLN: 500.0000 mL | Freq: Once | INTRAVENOUS | Status: DC

## 2012-05-04 MED ORDER — ZOLPIDEM TARTRATE 5 MG PO TABS: 5.0000 mg | ORAL_TABLET | Freq: Every evening | ORAL | Status: DC | PRN

## 2012-05-04 MED ORDER — PHENYLEPHRINE 40 MCG/ML (10ML) SYRINGE FOR IV PUSH (FOR BLOOD PRESSURE SUPPORT)
80.0000 ug | PREFILLED_SYRINGE | INTRAVENOUS | Status: DC | PRN
  Filled 2012-05-04: qty 5

## 2012-05-04 NOTE — H&P (Addendum)
37 y.o. [redacted]w[redacted]d  [id number] comes in c/o ctx overnight, presents for scheduled term elective IOL.  Otherwise has good fetal movement and no bleeding.  Past Medical History  Diagnosis Date  . No pertinent past medical history   . Depression     pp  . Abnormal Pap smear   . Hx of varicella   . Frequent UTI   . Kidney stones     Past Surgical History  Procedure Date  . Kidney stone surgery   . Dilation and curettage of uterus   . Leep     OB History    Grav Para Term Preterm Abortions TAB SAB Ect Mult Living   4 2 2  1  1   2      # Outc Date GA Lbr Len/2nd Wgt Sex Del Anes PTL Lv   1 TRM 1998 [redacted]w[redacted]d 10:00 7lb15oz(3.6kg) F SVD EPI  Yes   2 SAB 2011           3 TRM 2012 [redacted]w[redacted]d 08:00 9lb(4.082kg) M SVD EPI  Yes   4 CUR               History   Social History  . Marital Status: Married    Spouse Name: N/A    Number of Children: N/A  . Years of Education: N/A   Occupational History  . Not on file.   Social History Main Topics  . Smoking status: Never Smoker   . Smokeless tobacco: Never Used  . Alcohol Use: No  . Drug Use: No  . Sexually Active: Yes   Other Topics Concern  . Not on file   Social History Narrative  . No narrative on file   Codeine sulfate    Prenatal Transfer Tool  Maternal Diabetes: No Genetic Screening: Declined Fetal Ultrasounds or other Referrals:  Other: nl anatomy scan Maternal Substance Abuse:  No Significant Maternal Medications:  None Significant Maternal Lab Results: None  Other PNC:    Filed Vitals:   05/04/12 0742  BP: 114/84  Pulse: 119  Temp: 97.7 F (36.5 C)  Resp: 20     Lungs/Cor:  NAD Abdomen:  soft, gravid Ex:  no cords, erythema SVE:  4/70/-2 FHTs:  150, good STV, NST R Toco:  q irreg   A/P   Admit for elective IOL  GBS Neg  Epidural if desired  Will attempt AROM  Elishua Radford   Head at -2, AROM not possible Will start pitocin 2x2, epidural when desired

## 2012-05-04 NOTE — Anesthesia Preprocedure Evaluation (Signed)
Anesthesia Evaluation  Patient identified by MRN, date of birth, ID band Patient awake    Reviewed: Allergy & Precautions, H&P , NPO status , Patient's Chart, lab work & pertinent test results  Airway Mallampati: I TM Distance: >3 FB Neck ROM: full    Dental No notable dental hx.    Pulmonary neg pulmonary ROS,    Pulmonary exam normal       Cardiovascular negative cardio ROS      Neuro/Psych negative neurological ROS     GI/Hepatic negative GI ROS, Neg liver ROS,   Endo/Other  negative endocrine ROS  Renal/GU negative Renal ROS  negative genitourinary   Musculoskeletal negative musculoskeletal ROS (+)   Abdominal Normal abdominal exam  (+)   Peds negative pediatric ROS (+)  Hematology negative hematology ROS (+)   Anesthesia Other Findings   Reproductive/Obstetrics (+) Pregnancy                           Anesthesia Physical Anesthesia Plan  ASA: II  Anesthesia Plan: Epidural   Post-op Pain Management:    Induction:   Airway Management Planned:   Additional Equipment:   Intra-op Plan:   Post-operative Plan:   Informed Consent: I have reviewed the patients History and Physical, chart, labs and discussed the procedure including the risks, benefits and alternatives for the proposed anesthesia with the patient or authorized representative who has indicated his/her understanding and acceptance.     Plan Discussed with:   Anesthesia Plan Comments:         Anesthesia Quick Evaluation  

## 2012-05-05 LAB — CBC
Hemoglobin: 11.4 g/dL — ABNORMAL LOW (ref 12.0–15.0)
MCH: 29.5 pg (ref 26.0–34.0)
MCHC: 33 g/dL (ref 30.0–36.0)
Platelets: 229 10*3/uL (ref 150–400)
RDW: 14.9 % (ref 11.5–15.5)

## 2012-05-05 MED ORDER — INFLUENZA VIRUS VACC SPLIT PF IM SUSP
0.5000 mL | INTRAMUSCULAR | Status: AC
  Administered 2012-05-05: 0.5 mL via INTRAMUSCULAR
  Filled 2012-05-05: qty 0.5

## 2012-05-05 NOTE — Clinical Social Work Note (Signed)
CSW spoke with MOB briefly.  MOB reports hx from when she was 20 and first pregnancy.  MOB knows symptoms to look out for and has no current concerns for PPD.  MOB was instructed to let CSW or RN know if any concerns arise.  Patient was referred for history of depression/anxiety.  * Referral screened out by Clinical Social Worker because none of the following criteria appear to apply: ~ History of anxiety/depression during this pregnancy, or of post-partum depression. ~ Diagnosis of anxiety and/or depression within last 3 years ~ History of depression due to pregnancy loss/loss of child  OR  * Patient's symptoms currently being treated with medication and/or therapy.  Please contact the Clinical Social Worker if needs arise, or by the patient's request. 

## 2012-05-05 NOTE — Anesthesia Postprocedure Evaluation (Signed)
  Anesthesia Post-op Note  [name]  Procedure(s) Performed: * No procedures listed *  Patient Location: PACU and Mother/Baby  Anesthesia Type:Epidural  Level of Consciousness: awake, alert  and oriented  Airway and Oxygen Therapy: Patient Spontanous Breathing  Post-op Pain: none  Post-op Assessment: Post-op Vital signs reviewed, Patient's Cardiovascular Status Stable, No headache, No backache, No residual numbness and No residual motor weakness  Post-op Vital Signs: Reviewed and stable  Complications: No apparent anesthesia complications

## 2012-05-06 NOTE — Discharge Summary (Signed)
Obstetric Discharge Summary Reason for Admission: induction of labor Prenatal Procedures: none Intrapartum Procedures: spontaneous vaginal delivery Postpartum Procedures: none Complications-Operative and Postpartum: 1st degree perineal laceration Hemoglobin  Date Value Range Status  05/05/2012 11.4* 12.0 - 15.0 g/dL Final     HCT  Date Value Range Status  05/05/2012 34.5* 36.0 - 46.0 % Final    Physical Exam:  General: alert and cooperative Lochia: appropriate Uterine Fundus: firm DVT Evaluation: No evidence of DVT seen on physical exam.  Discharge Diagnoses: Term Pregnancy-delivered  Discharge Information: Date: 05/06/2012 Activity: pelvic rest Diet: routine Medications: PNV and Ibuprofen Condition: stable Instructions: refer to practice specific booklet Discharge to: home Follow-up Information    Follow up with Megan Woessner, DO. In 4 weeks.   Contact information:   [address] [place] [address] [phone]          Newborn Data: Live born female  Birth Weight: 8 lb 4 oz (3742 g) APGAR: ,   Home with mother.  [name] 05/06/2012, 7:22 AM

## 2012-05-07 ENCOUNTER — Encounter (HOSPITAL_COMMUNITY): Payer: Self-pay

## 2012-08-20 ENCOUNTER — Other Ambulatory Visit: Payer: Self-pay | Admitting: Obstetrics and Gynecology

## 2013-01-31 ENCOUNTER — Emergency Department
Admission: EM | Admit: 2013-01-31 | Discharge: 2013-01-31 | Disposition: A | Discharge status: Home / Self Care | Payer: BC Managed Care – PPO | Source: Home / Self Care

## 2013-01-31 ENCOUNTER — Emergency Department (INDEPENDENT_AMBULATORY_CARE_PROVIDER_SITE_OTHER): Payer: BC Managed Care – PPO

## 2013-01-31 ENCOUNTER — Encounter: Payer: Self-pay | Admitting: Emergency Medicine

## 2013-01-31 DIAGNOSIS — S8000XA Contusion of unspecified knee, initial encounter: Secondary | ICD-10-CM

## 2013-01-31 DIAGNOSIS — M25569 Pain in unspecified knee: Secondary | ICD-10-CM

## 2013-01-31 DIAGNOSIS — S8001XA Contusion of right knee, initial encounter: Secondary | ICD-10-CM

## 2013-01-31 MED ORDER — MELOXICAM 15 MG PO TABS: 15.0000 mg | ORAL_TABLET | Freq: Every day | ORAL | Status: DC

## 2013-01-31 NOTE — ED Notes (Signed)
[name] reports falling onto her right knee 2 days ago in her carport. She does have a hx of pain with this knee from running track. Site is edematous and bruised.

## 2013-01-31 NOTE — ED Provider Notes (Signed)
CSN: [id number]     Arrival date & time 01/31/13  1424 History   None    Chief Complaint  Patient presents with  . Knee Injury      HPI Comments: Patient fell two days ago in her carport, landing primarily on her right anterior knee.  She had immediate painful swelling that has persisted.  She has pain with walking, standing, and extension/flexion of her right knee.   Prior to her injury she has had gradually increasing pain and clicking in her right knee.  She had run track in the past.  Patient is a 37 y.o. female presenting with knee pain. The history is provided by the patient.  Knee Pain Lower extremity pain location: right knee. Time since incident:  2 days Injury: yes   Mechanism of injury: fall   Fall:    Fall occurred:  Tripped   Impact surface:  Primary school teacher of impact:  Knees Pain details:    Quality:  Aching   Radiates to:  Does not radiate   Severity:  Moderate   Onset quality:  Sudden   Duration:  2 days   Timing:  Intermittent   Progression:  Unchanged Chronicity:  New Dislocation: no   Prior injury to area:  No Relieved by:  Nothing Worsened by:  Bearing weight, extension and flexion Ineffective treatments:  Ice Associated symptoms: decreased ROM, stiffness and swelling   Associated symptoms: no back pain, no fatigue, no fever, no muscle weakness, no numbness and no tingling     Past Medical History  Diagnosis Date  . No pertinent past medical history   . Depression     pp  . Abnormal Pap smear   . Hx of varicella   . Frequent UTI   . Kidney stones    Past Surgical History  Procedure Laterality Date  . Kidney stone surgery    . Dilation and curettage of uterus    . Leep     Family History  Problem Relation Age of Onset  . Diabetes Father   . Anemia Mother   . Cancer Mother     bone  . Anemia Brother   . Hypertension Maternal Grandmother    History  Substance Use Topics  . Smoking status: Never Smoker   . Smokeless tobacco: Never  Used  . Alcohol Use: No   OB History   Grav Para Term Preterm Abortions TAB SAB Ect Mult Living   4 3 3  1  1   3      Review of Systems  Constitutional: Negative for fever and fatigue.  Musculoskeletal: Positive for stiffness. Negative for back pain.    Allergies  Codeine sulfate  Home Medications   Current Outpatient Rx  Name  Route  Sig  Dispense  Refill  . meloxicam (MOBIC) 15 MG tablet   Oral   Take 1 tablet (15 mg total) by mouth daily. Take with food each morning   15 tablet   0    BP 116/77  Pulse 84  Resp 12  Ht 5\' 5"  (1.651 m)  Wt 138 lb (62.596 kg)  BMI 22.96 kg/m2  SpO2 100%  LMP 01/21/2013 Physical Exam  Nursing note and vitals reviewed. Constitutional: She is oriented to person, place, and time. She appears well-developed and well-nourished. No distress.  HENT:  Head: Atraumatic.  Eyes: Conjunctivae are normal. Pupils are equal, round, and reactive to light.  Musculoskeletal:       Right knee:  She exhibits decreased range of motion, swelling and ecchymosis. She exhibits no effusion, no deformity, no laceration, no erythema, normal alignment, no LCL laxity, normal patellar mobility, no bony tenderness, normal meniscus and no MCL laxity. Tenderness found. Patellar tendon tenderness noted. No medial joint line, no lateral joint line, no MCL and no LCL tenderness noted.       Legs: Right knee has tenderness and swelling over the patella and prepatellar bursa.  She has pain with full flexion  Neurological: She is alert and oriented to person, place, and time.  Skin: Skin is warm and dry.    ED Course  Procedures  none    Imaging Review Dg Knee Complete 4 Views Right  01/31/2013   CLINICAL DATA:  Right knee pain after fall.  EXAM: RIGHT KNEE - COMPLETE 4+ VIEW  COMPARISON:  None.  FINDINGS: There is no evidence of fracture, dislocation, or joint effusion. There is no evidence of arthropathy or other focal bone abnormality. Soft tissues are unremarkable.   IMPRESSION: Normal right knee.   Electronically Signed   By: Roque Lias M.D.   On: 01/31/2013 15:23      MDM   1. Patellar contusion, right, initial encounter    Ace wrap applied. Begin Mobic.  Wear ace wrap until swelling resolves.  Apply ice pack several times daily.  May take Tylenol once or twice daily as needed for pain.  Begin range of motion exercises in about 5 days ([facility] information and instruction handout given)  Followup with [facility] if not improving about two weeks.     Lattie Haw, MD 01/31/13 [phone]

## 2013-08-29 ENCOUNTER — Other Ambulatory Visit: Payer: Self-pay | Admitting: Obstetrics and Gynecology

## 2013-10-16 ENCOUNTER — Encounter: Payer: Self-pay | Admitting: *Deleted

## 2013-10-16 ENCOUNTER — Encounter: Payer: Self-pay | Admitting: Cardiology

## 2013-10-16 ENCOUNTER — Ambulatory Visit (INDEPENDENT_AMBULATORY_CARE_PROVIDER_SITE_OTHER): Payer: BC Managed Care – PPO | Admitting: Cardiology

## 2013-10-16 VITALS — BP 124/80 | HR 79 | Ht 65.0 in | Wt 131.0 lb

## 2013-10-16 DIAGNOSIS — R079 Chest pain, unspecified: Secondary | ICD-10-CM

## 2013-10-16 DIAGNOSIS — E785 Hyperlipidemia, unspecified: Secondary | ICD-10-CM | POA: Insufficient documentation

## 2013-10-16 DIAGNOSIS — R072 Precordial pain: Secondary | ICD-10-CM

## 2013-10-16 NOTE — Progress Notes (Signed)
     HPI: 38 year old female for evaluation of chest pain and risk factors. Patient has occasional chest pain in the left chest area. Typically occurs with anxiety. Last several minutes and resolve spontaneously. No radiation or associated symptoms. Not pleuritic, positional, exertional or related to food. She has no dyspnea on exertion, orthopnea, PND, pedal edema or syncope. No exertional chest pain. She did lose her father last fall from a myocardial infarction. We are asked to further evaluate.  No current outpatient prescriptions on file.   No current facility-administered medications for this visit.    Allergies  Allergen Reactions  . Codeine Sulfate Other (See Comments)    REACTION: hallucinations     Past Medical History  Diagnosis Date  . Depression     pp  . Abnormal Pap smear   . Hx of varicella   . Frequent UTI   . Kidney stones   . Hyperlipidemia     Past Surgical History  Procedure Laterality Date  . Kidney stone surgery    . Dilation and curettage of uterus    . Leep      History   Social History  . Marital Status: Married    Spouse Name: N/A    Number of Children: 3  . Years of Education: N/A   Occupational History  .  Other   Social History Main Topics  . Smoking status: Former Games developermoker  . Smokeless tobacco: Never Used  . Alcohol Use: Yes     Comment: Occasional  . Drug Use: No  . Sexual Activity: Yes   Other Topics Concern  . Not on file   Social History Narrative  . No narrative on file    Family History  Problem Relation Age of Onset  . Diabetes Father   . Anemia Mother   . Cancer Mother     bone  . Anemia Brother   . Hypertension Maternal Grandmother   . CAD Father     Died of MI at age 36  . Hyperlipidemia Mother     ROS: no fevers or chills, productive cough, hemoptysis, dysphasia, odynophagia, melena, hematochezia, dysuria, hematuria, rash, seizure activity, orthopnea, PND, pedal edema, claudication. Remaining systems are  negative.  Physical Exam:   Blood pressure 124/80, pulse 79, height 5\' 5"  (1.651 m), weight 131 lb (59.421 kg).  General:  Well developed/well nourished in NAD Skin warm/dry Patient not depressed No peripheral clubbing Back-normal HEENT-normal/normal eyelids Neck supple/normal carotid upstroke bilaterally; no bruits; no JVD; no thyromegaly chest - CTA/ normal expansion CV - RRR/normal S1 and S2; no murmurs, rubs or gallops;  PMI nondisplaced Abdomen -NT/ND, no HSM, no mass, + bowel sounds, no bruit 2+ femoral pulses, no bruits Ext-no edema, chords, 2+ DP Neuro-grossly nonfocal  ECG Normal sinus rhythm at a rate of 79. Normal axis. Cannot rule out prior septal infarct. Nonspecific ST changes.

## 2013-10-16 NOTE — Assessment & Plan Note (Addendum)
Apparently noted to have mild hyperlipidemia on most recent laboratories. Management per primary care. She has minimal risk factors. She is not diabetic, hypertensive and does not smoke. Her family history is not overly impressive as her father had his myocardial infarction at age 36. Would treat with diet at this point.

## 2013-10-16 NOTE — Patient Instructions (Addendum)
Your physician recommends that you schedule a follow-up appointment in:  AS NEEDED  Your physician has requested that you have a stress echocardiogram. For further information please visit [email or web address] Please follow instruction sheet as given.   Exercise Stress Echocardiogram An exercise stress echocardiogram is a heart (cardiac) test used to check the function of your heart. This test may also be called an exercise stress echocardiography or stress echo. This stress test will check how well your heart muscle and valves are working and determine if your heart muscle is getting enough blood. You will exercise on a treadmill to naturally increase or stress the functioning of your heart.  An echocardiogram uses sound waves (ultrasound) to produce an image of your heart. If your heart does not work normally, it may indicate coronary artery disease with poor coronary blood supply. The coronary arteries are the arteries that bring blood and oxygen to your heart. LET [facility] CARE PROVIDER KNOW ABOUT:  Any allergies you have.  All medicines you are taking, including vitamins, herbs, eye drops, creams, and over-the-counter medicines.  Previous problems you or members of your family have had with the use of anesthetics.  Any blood disorders you have.  Previous surgeries you have had.  Medical conditions you have.  Possibility of pregnancy, if this applies. RISKS AND COMPLICATIONS Generally, this is a safe procedure. However, as with any procedure, complications can occur. Possible complications can include:  You develop pain or pressure in the following areas:  Chest.  Jaw or neck.  Between your shoulder blades.  Radiating down your left arm.  Dizziness or lightheadedness.  Shortness of breath.  Increased or irregular heartbeat.  Nausea or vomiting.  Heart attack (rare). BEFORE THE PROCEDURE  Avoid all forms of caffeine for 24 hours before your test or as directed by  your health care provider. This includes coffee, tea (even decaffeinated tea), caffeinated sodas, chocolate, cocoa, and certain pain medicines.  Follow your health care provider's instructions regarding eating and drinking before the test.  Take your medicines as directed at regular times with water unless instructed otherwise. Exceptions may include:  If you have diabetes, ask how you are to take your insulin or pills. It is common to adjust insulin dosing the morning of the test.  If you are taking beta-blocker medicines, it is important to talk to your health care provider about these medicines well before the date of your test. Taking beta-blocker medicines may interfere with the test. In some cases, these medicines need to be changed or stopped 24 hours or more before the test.  If you wear a nitroglycerin patch, it may need to be removed prior to the test. Ask your health care provider if the patch should be removed before the test.  If you use an inhaler for any breathing condition, bring it with you to the test.  If you are an outpatient, bring a snack so you can eat right after the stress phase of the test.  Do not smoke for 4 hours prior to the test or as directed by your health care provider.  Wear loose-fitting clothes and comfortable shoes for the test. This test involves walking on a treadmill. PROCEDURE   Multiple electrodes will be put on your chest. If needed, small areas of your chest may be shaved to get better contact with the electrodes. Once the electrodes are attached to your body, multiple wires will be attached to the electrodes, and your heart rate will be monitored.  You will have an echocardiogram done at rest.  To produce this image of your heart, gel is applied to your chest, and a wand-like tool (transducer) is moved over the chest. The transducer sends the sound waves through the chest to create the moving images of your heart.  You may need an IV to receive  a medication that improves the quality of the pictures.  You will then walk on a treadmill. The treadmill will be started at a slow pace. The treadmill speed and incline will gradually be increased to raise your heart rate.  At the peak of exercise, the treadmill will be stopped. You will lie down immediately on a bed so that a second echocardiogram can be done to visualize your heart's motion with exercise.  The test usually takes 30-60 minutes to complete. AFTER THE PROCEDURE  Your heart rate and blood pressure will be monitored after the test.  You may return to your normal schedule, including diet, activities, and medicines, unless your health care provider tells you otherwise. Document Released: 03/25/2004 Document Revised: 03/26/2013 Document Reviewed: 11/26/2012 [facility] Patient Information 2015 [employer], [place]. This information is not intended to replace advice given to you by your health care provider. Make sure you discuss any questions you have with your health care provider.

## 2013-10-16 NOTE — Assessment & Plan Note (Signed)
Symptoms atypical. Plan stress echocardiogram to further assess. She has nonspecific ST changes on electrocardiogram and therefore requires imaging. There is also note that prior septal infarct cannot be excluded. Echocardiogram will help assess wall motion.

## 2013-11-01 ENCOUNTER — Other Ambulatory Visit (HOSPITAL_COMMUNITY): Payer: BC Managed Care – PPO

## 2013-11-13 ENCOUNTER — Other Ambulatory Visit (HOSPITAL_COMMUNITY): Payer: BC Managed Care – PPO

## 2014-02-03 ENCOUNTER — Encounter: Payer: Self-pay | Admitting: Cardiology

## 2014-05-01 ENCOUNTER — Other Ambulatory Visit: Payer: Self-pay | Admitting: Obstetrics and Gynecology

## 2014-05-01 DIAGNOSIS — N63 Unspecified lump in unspecified breast: Secondary | ICD-10-CM

## 2014-05-02 ENCOUNTER — Other Ambulatory Visit: Payer: Self-pay | Admitting: Obstetrics and Gynecology

## 2014-05-02 ENCOUNTER — Ambulatory Visit
Admission: RE | Admit: 2014-05-02 | Discharge: 2014-05-02 | Disposition: A | Discharge status: Home / Self Care | Payer: PRIVATE HEALTH INSURANCE | Source: Ambulatory Visit | Attending: Obstetrics and Gynecology | Admitting: Obstetrics and Gynecology

## 2014-05-02 DIAGNOSIS — N63 Unspecified lump in unspecified breast: Secondary | ICD-10-CM

## 2014-06-11 ENCOUNTER — Telehealth: Payer: Self-pay | Admitting: Genetic Counselor

## 2014-06-11 NOTE — Telephone Encounter (Signed)
Lt mess regarding a new pt appt for genetics Dx: Genetic testing Referring Dr. Tenny Crawoss

## 2014-06-13 ENCOUNTER — Telehealth: Payer: Self-pay | Admitting: Genetic Counselor

## 2014-06-13 NOTE — Telephone Encounter (Signed)
Called patient.  Confirmed appt with Maylon CosKaren Powell on 06/30/14. Dx: Genetic Counseling Referring: Dr. Tenny Crawoss

## 2014-06-30 ENCOUNTER — Encounter: Payer: PRIVATE HEALTH INSURANCE | Admitting: Genetic Counselor

## 2014-06-30 ENCOUNTER — Other Ambulatory Visit: Payer: PRIVATE HEALTH INSURANCE

## 2014-09-03 ENCOUNTER — Other Ambulatory Visit: Payer: Self-pay | Admitting: Obstetrics and Gynecology

## 2014-09-03 DIAGNOSIS — R922 Inconclusive mammogram: Secondary | ICD-10-CM

## 2014-09-03 DIAGNOSIS — N6489 Other specified disorders of breast: Secondary | ICD-10-CM

## 2014-09-15 ENCOUNTER — Other Ambulatory Visit: Payer: Self-pay | Admitting: Obstetrics and Gynecology

## 2014-09-17 LAB — CYTOLOGY - PAP

## 2014-09-26 ENCOUNTER — Ambulatory Visit
Admission: RE | Admit: 2014-09-26 | Discharge: 2014-09-26 | Disposition: A | Discharge status: Home / Self Care | Payer: PRIVATE HEALTH INSURANCE | Source: Ambulatory Visit | Attending: Obstetrics and Gynecology | Admitting: Obstetrics and Gynecology

## 2014-09-26 DIAGNOSIS — R922 Inconclusive mammogram: Secondary | ICD-10-CM

## 2014-10-30 ENCOUNTER — Other Ambulatory Visit: Payer: Self-pay | Admitting: Obstetrics and Gynecology

## 2014-10-30 ENCOUNTER — Ambulatory Visit
Admission: RE | Admit: 2014-10-30 | Discharge: 2014-10-30 | Disposition: A | Discharge status: Home / Self Care | Payer: PRIVATE HEALTH INSURANCE | Source: Ambulatory Visit | Attending: Obstetrics and Gynecology | Admitting: Obstetrics and Gynecology

## 2014-10-30 DIAGNOSIS — N6489 Other specified disorders of breast: Secondary | ICD-10-CM

## 2014-10-30 DIAGNOSIS — R922 Inconclusive mammogram: Secondary | ICD-10-CM

## 2015-02-03 ENCOUNTER — Encounter: Payer: Self-pay | Admitting: Physician Assistant

## 2015-02-03 ENCOUNTER — Ambulatory Visit (INDEPENDENT_AMBULATORY_CARE_PROVIDER_SITE_OTHER): Payer: PRIVATE HEALTH INSURANCE | Admitting: Physician Assistant

## 2015-02-03 VITALS — BP 106/63 | HR 75 | Ht 65.0 in | Wt 121.0 lb

## 2015-02-03 DIAGNOSIS — F411 Generalized anxiety disorder: Secondary | ICD-10-CM

## 2015-02-03 DIAGNOSIS — F429 Obsessive-compulsive disorder, unspecified: Secondary | ICD-10-CM | POA: Diagnosis not present

## 2015-02-03 MED ORDER — SERTRALINE HCL 50 MG PO TABS: 50.0000 mg | ORAL_TABLET | Freq: Every day | ORAL | Status: DC

## 2015-02-03 NOTE — Progress Notes (Signed)
   Subjective:    Patient ID: [name], female    DOB: 09/03/1975, 39 y.o.   MRN: [id number]  HPI Patient is a 39 year old female who presents to the clinic today to establish care. She comes in because she's had almost 1 year of very obsessive symptoms. Approximately January 2015 she had a breast rash in which she had a diagnostic mammogram. Some cysts were found but nothing pathological. Ever since she has been obsessed with her breasts. She does approximately 50 self breast exams a day. She cannot get it out of her head that something may be wrong. She is unable to have sex because she is focused on him finding a breast lump or something cancerous. She does Internet searches all day long or how to prevent breast cancer. She is eating a completely organic diet now. She will not drink alcohol because of his subtule l links to breast cancer. She is on a multiple vitamin regimen and probiotic to prevent cancer. She is off all other medications. She denies any suicidal or homicidal thoughts.   .. Active Ambulatory Problems    Diagnosis Date Noted  . Chest pain 10/16/2013  . Hyperlipidemia 10/16/2013   Resolved Ambulatory Problems    Diagnosis Date Noted  . No Resolved Ambulatory Problems   Past Medical History  Diagnosis Date  . Depression   . Abnormal Pap smear   . Hx of varicella   . Frequent UTI   . Kidney stones    .Marland Kitchen. Family History  Problem Relation Age of Onset  . Diabetes Father   . CAD Father     Died of MI at age 37  . Anemia Mother   . Cancer Mother     bone  . Hyperlipidemia Mother   . Anemia Brother   . Hypertension Maternal Grandmother   . Cancer Paternal Aunt     pancreatic  . Alcohol abuse Maternal Grandfather    .Marland Kitchen. Social History   Social History  . Marital Status: Married    Spouse Name: N/A  . Number of Children: 3  . Years of Education: N/A   Occupational History  .  Other   Social History Main Topics  . Smoking status: Former Games developermoker  .  Smokeless tobacco: Never Used  . Alcohol Use: Yes     Comment: Occasional  . Drug Use: No  . Sexual Activity: Yes   Other Topics Concern  . Not on file   Social History Narrative      Review of Systems  All other systems reviewed and are negative.      Objective:   Physical Exam  Constitutional: She is oriented to person, place, and time. She appears well-developed and well-nourished.  HENT:  Head: Normocephalic and atraumatic.  Cardiovascular: Normal rate, regular rhythm and normal heart sounds.   Pulmonary/Chest: Effort normal and breath sounds normal. She has no wheezes.  Neurological: She is alert and oriented to person, place, and time.  Skin: Skin is dry.  Psychiatric: Her behavior is normal.  Anxious.           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 PHQ-9 was 6. Definately think patient has some obsessive compulsive disorders today. Referred for counseling with [facility]. Started Zoloft 50 mg daily. For the first week start with one half tab. Discussed side effects of medication. Call office with any worsening symptoms. Follow-up in 4-6 weeks. Continue to use Xanax as needed.

## 2015-02-16 ENCOUNTER — Ambulatory Visit (INDEPENDENT_AMBULATORY_CARE_PROVIDER_SITE_OTHER): Payer: PRIVATE HEALTH INSURANCE | Admitting: Physician Assistant

## 2015-02-16 ENCOUNTER — Encounter: Payer: Self-pay | Admitting: Physician Assistant

## 2015-02-16 ENCOUNTER — Ambulatory Visit (INDEPENDENT_AMBULATORY_CARE_PROVIDER_SITE_OTHER): Payer: PRIVATE HEALTH INSURANCE

## 2015-02-16 VITALS — BP 98/55 | HR 71 | Temp 98.2°F | Ht 65.0 in | Wt 121.0 lb

## 2015-02-16 DIAGNOSIS — N200 Calculus of kidney: Secondary | ICD-10-CM

## 2015-02-16 DIAGNOSIS — R109 Unspecified abdominal pain: Secondary | ICD-10-CM

## 2015-02-16 DIAGNOSIS — Z87442 Personal history of urinary calculi: Secondary | ICD-10-CM

## 2015-02-16 LAB — POCT URINALYSIS DIPSTICK
Bilirubin, UA: NEGATIVE
Blood, UA: NEGATIVE
GLUCOSE UA: NEGATIVE
Ketones, UA: NEGATIVE
Leukocytes, UA: NEGATIVE
Nitrite, UA: NEGATIVE
Protein, UA: NEGATIVE
SPEC GRAV UA: 1.015
UROBILINOGEN UA: 1
pH, UA: 8.5

## 2015-02-16 MED ORDER — CYCLOBENZAPRINE HCL 10 MG PO TABS: 10.0000 mg | ORAL_TABLET | Freq: Three times a day (TID) | ORAL | Status: DC | PRN

## 2015-02-16 MED ORDER — HYDROCODONE-ACETAMINOPHEN 7.5-325 MG PO TABS: 1.0000 | ORAL_TABLET | Freq: Three times a day (TID) | ORAL | Status: DC | PRN

## 2015-02-16 MED ORDER — TAMSULOSIN HCL 0.4 MG PO CAPS: 0.4000 mg | ORAL_CAPSULE | Freq: Every day | ORAL | Status: DC

## 2015-02-16 NOTE — Progress Notes (Signed)
   Subjective:    Patient ID: [name], female    DOB: 01/15/1976, 39 y.o.   MRN: [id number]  HPI  Pt presents to the clinic with left flank pain for the last 2 weeks off and on. Gradual becoming worse. Hx of kidney stones since 17. Family hx with father, cousins, uncles.  Has had stents 2-3 times. Started having cloudy urine yesterday. No fever, chills, nausea or vomiting. Denies any trauma or injury. Not tried anything to make better.   Review of Systems  All other systems reviewed and are negative.      Objective:   Physical Exam  Constitutional: She is oriented to person, place, and time. She appears well-developed and well-nourished.  HENT:  Head: Normocephalic and atraumatic.  Cardiovascular: Normal rate, regular rhythm and normal heart sounds.   Pulmonary/Chest: Effort normal and breath sounds normal.  Left CVA tenderness only.   Abdominal: Soft. Bowel sounds are normal. She exhibits no distension and no mass. There is no tenderness. There is no rebound and no guarding.  Neurological: She is alert and oriented to person, place, and time.  Psychiatric: She has a normal mood and affect. Her behavior is normal.          Assessment & Plan:  Left flank pain- .. Results for orders placed or performed in visit on 02/16/15  POCT urinalysis dipstick  Result Value Ref Range   Color, UA yellow    Clarity, UA cloudy    Glucose, UA neg    Bilirubin, UA neg    Ketones, UA neg    Spec Grav, UA 1.015    Blood, UA neg    pH, UA 8.5    Protein, UA neg    Urobilinogen, UA 1.0    Nitrite, UA neg    Leukocytes, UA Negative Negative   No blood but due to pt's hx will get CT scan stone protocol. We want to catch if stone will need stent or not.  Strainer given with hydrocodone and flomax. Increase hydration. No infection on exam today.    Addendum: CT revealed no cause for left flank pain. There was non-obstructing stones in the right. Discussed with pt pain is likely  musculoskeletal. Flexeril and ibuprofen with warm bath/compresses and stretches. Call if pain changes or continues. Don't need to take flomax.

## 2015-02-18 LAB — URINE CULTURE
COLONY COUNT: NO GROWTH
ORGANISM ID, BACTERIA: NO GROWTH

## 2015-03-03 ENCOUNTER — Ambulatory Visit: Payer: PRIVATE HEALTH INSURANCE | Admitting: Physician Assistant

## 2015-03-04 ENCOUNTER — Encounter: Payer: Self-pay | Admitting: Physician Assistant

## 2015-03-04 ENCOUNTER — Ambulatory Visit (INDEPENDENT_AMBULATORY_CARE_PROVIDER_SITE_OTHER): Payer: PRIVATE HEALTH INSURANCE | Admitting: Physician Assistant

## 2015-03-04 VITALS — BP 96/50 | HR 75 | Ht 65.0 in | Wt 116.0 lb

## 2015-03-04 DIAGNOSIS — Z8619 Personal history of other infectious and parasitic diseases: Secondary | ICD-10-CM

## 2015-03-04 DIAGNOSIS — F429 Obsessive-compulsive disorder, unspecified: Secondary | ICD-10-CM

## 2015-03-04 DIAGNOSIS — I959 Hypotension, unspecified: Secondary | ICD-10-CM

## 2015-03-04 DIAGNOSIS — G4700 Insomnia, unspecified: Secondary | ICD-10-CM

## 2015-03-04 DIAGNOSIS — N879 Dysplasia of cervix uteri, unspecified: Secondary | ICD-10-CM

## 2015-03-04 DIAGNOSIS — F411 Generalized anxiety disorder: Secondary | ICD-10-CM

## 2015-03-04 MED ORDER — BUSPIRONE HCL 5 MG PO TABS: 5.0000 mg | ORAL_TABLET | Freq: Two times a day (BID) | ORAL | Status: DC

## 2015-03-04 MED ORDER — SERTRALINE HCL 100 MG PO TABS: 100.0000 mg | ORAL_TABLET | Freq: Every day | ORAL | Status: DC

## 2015-03-04 MED ORDER — TRAZODONE HCL 50 MG PO TABS: 50.0000 mg | ORAL_TABLET | Freq: Every day | ORAL | Status: DC

## 2015-03-04 NOTE — Progress Notes (Signed)
   Subjective:    Patient ID: [name], female    DOB: 12/25/1975, [age] y.o.   MRN: [id number]  HPI  Pt presents to the clinic to follow up after 4 weeks of starting zoloft for OCD and GAD. At 2 week mark she was doing marketly better then she decided to have sex with husband and saw a tinge of blood after sex that immediately resolved and she is convinced she might have cervical cancer. Hx of HPV. Last normal pap was 09/2014. No pain associated. It is hard for her to sleep. She finds very little pleasure in life. She has decreased her self breast exams and not thinking of that as much. Very anxious and always feels jittery inside. No suicidal or homicidal thoughts.     Review of Systems  All other systems reviewed and are negative.      Objective:   Physical Exam  Constitutional: She is oriented to person, place, and time. She appears well-developed and well-nourished.  HENT:  Head: Normocephalic and atraumatic.  Neurological: She is alert and oriented to person, place, and time.  Psychiatric:  Flat affect          Assessment & Plan:  OCD/GAD- PHQ-9 was 4 down from 6. GAD-7 was 21 and stayed the same. Discussed need for counselor. Increased zoloft to 100mg  daily. Added buspar 5mg  up to TID. Trazodone added for sleep. Only use xanax as needed.  Follow up in 1-2 months sooner if worsening.   Bleeding after sex/hx of HPV- reassured pt due to normal pap in June no need for another pap will recheck in 6 months. Encouraged to use lubrication. If happens again follow up. i feel like this is just another obscession right now to worry about.   Hypotension- encouraged pt to add fluids and salt to diet. Pt is asymptomatic and ahs hx of low blood pressure. Follow up with concerns.

## 2015-03-06 DIAGNOSIS — N879 Dysplasia of cervix uteri, unspecified: Secondary | ICD-10-CM | POA: Insufficient documentation

## 2015-03-06 DIAGNOSIS — Z8619 Personal history of other infectious and parasitic diseases: Secondary | ICD-10-CM | POA: Insufficient documentation

## 2015-03-06 DIAGNOSIS — G479 Sleep disorder, unspecified: Secondary | ICD-10-CM | POA: Insufficient documentation

## 2015-03-06 DIAGNOSIS — G4700 Insomnia, unspecified: Secondary | ICD-10-CM

## 2015-03-17 ENCOUNTER — Other Ambulatory Visit: Payer: Self-pay | Admitting: *Deleted

## 2015-03-17 MED ORDER — ALPRAZOLAM 0.5 MG PO TABS: 0.5000 mg | ORAL_TABLET | Freq: Two times a day (BID) | ORAL | Status: DC | PRN

## 2015-05-18 ENCOUNTER — Encounter: Payer: Self-pay | Admitting: Physician Assistant

## 2015-05-18 DIAGNOSIS — N920 Excessive and frequent menstruation with regular cycle: Secondary | ICD-10-CM | POA: Insufficient documentation

## 2015-05-24 ENCOUNTER — Encounter: Payer: Self-pay | Admitting: Emergency Medicine

## 2015-05-24 ENCOUNTER — Emergency Department (INDEPENDENT_AMBULATORY_CARE_PROVIDER_SITE_OTHER)
Admission: EM | Admit: 2015-05-24 | Discharge: 2015-05-24 | Disposition: A | Discharge status: Home / Self Care | Payer: PRIVATE HEALTH INSURANCE | Source: Home / Self Care | Attending: Family Medicine | Admitting: Family Medicine

## 2015-05-24 DIAGNOSIS — J069 Acute upper respiratory infection, unspecified: Secondary | ICD-10-CM

## 2015-05-24 DIAGNOSIS — B9789 Other viral agents as the cause of diseases classified elsewhere: Principal | ICD-10-CM

## 2015-05-24 MED ORDER — BENZONATATE 200 MG PO CAPS: 200.0000 mg | ORAL_CAPSULE | Freq: Every day | ORAL | Status: DC

## 2015-05-24 MED ORDER — AZITHROMYCIN 250 MG PO TABS: ORAL_TABLET | ORAL | Status: DC

## 2015-05-24 NOTE — ED Notes (Signed)
Pt c/o possible sinus infection x 3 days, awoke this am with left ear pain, cough, runny nose.

## 2015-05-24 NOTE — ED Provider Notes (Signed)
CSN: [id number]     Arrival date & time 05/24/15  1341 History   First MD Initiated Contact with Patient 05/24/15 1408     Chief Complaint  Patient presents with  . Recurrent Sinusitis      HPI Comments: Patient complains of three day history of typical cold-like symptoms including mild sore throat, sinus congestion, headache, fatigue, and cough.  She believes that she may have a recurrent sinus infection.  The history is provided by the patient.    Past Medical History  Diagnosis Date  . Depression     pp  . Abnormal Pap smear   . Hx of varicella   . Frequent UTI   . Kidney stones   . Hyperlipidemia    Past Surgical History  Procedure Laterality Date  . Kidney stone surgery    . Dilation and curettage of uterus    . Leep     Family History  Problem Relation Age of Onset  . Diabetes Father   . CAD Father     Died of MI at age 60  . Anemia Mother   . Cancer Mother     bone  . Hyperlipidemia Mother   . Anemia Brother   . Hypertension Maternal Grandmother   . Cancer Paternal Aunt     pancreatic  . Alcohol abuse Maternal Grandfather    Social History  Substance Use Topics  . Smoking status: Former Games developer  . Smokeless tobacco: Never Used  . Alcohol Use: Yes     Comment: Occasional   OB History    Gravida Para Term Preterm AB TAB SAB Ectopic Multiple Living   Review of Systems ? sore throat + cough No pleuritic pain No wheezing + nasal congestion + post-nasal drainage + sinus pain/pressure No itchy/red eyes ? earache No hemoptysis No SOB No fever, + chills No nausea No vomiting No abdominal pain No diarrhea No urinary symptoms No skin rash + fatigue No myalgias + headache Used OTC meds without relief  Allergies  Review of patient's allergies indicates no known allergies.  Home Medications   Prior to Admission medications   Medication Sig Start Date End Date Taking? Authorizing Provider  azithromycin (ZITHROMAX Z-PAK)  250 MG tablet Take 2 tabs today; then begin one tab once daily for 4 more days. (Rx void after 06/01/15) 05/24/15   Lattie Haw, MD  benzonatate (TESSALON) 200 MG capsule Take 1 capsule (200 mg total) by mouth at bedtime. Take as needed for cough 05/24/15   Lattie Haw, MD  sertraline (ZOLOFT) 100 MG tablet Take 1 tablet (100 mg total) by mouth daily. 03/04/15   Jomarie Longs, PA-C   Meds Ordered and Administered this Visit  Medications - No data to display  BP 94/60 mmHg  Pulse 79  Temp(Src) 98.2 F (36.8 C) (Oral)  Ht  (1.651 m)  Wt 118 lb 8 oz (53.751 kg)  BMI 19.72 kg/m2  SpO2 100%  LMP 05/03/2015 No data found.   Physical Exam Nursing notes and Vital Signs reviewed. Appearance:  Patient appears stated age, and in no acute distress Eyes:  Pupils are equal, round, and reactive to light and accomodation.  Extraocular movement is intact.  Conjunctivae are not inflamed  Ears:  Canals normal.  Tympanic membranes normal.  Nose:  Mildly congested turbinates.  No sinus tenderness.   Pharynx:  Normal Neck:  Supple.  Tender  enlarged posterior nodes are palpated bilaterally  Lungs:  Clear to auscultation.  Breath sounds are equal.  Moving air well. Heart:  Regular rate and rhythm without murmurs, rubs, or gallops.  Abdomen:  Nontender without masses or hepatosplenomegaly.  Bowel sounds are present.  No CVA or flank tenderness.  Extremities:  No edema.  Skin:  No rash present.   ED Course  Procedures none    Labs Reviewed - Tympanogram normal both ears    MDM   1. Viral URI with cough    There is no evidence of bacterial infection today.  Treat symptomatically for now  Prescription written for Benzonatate (Tessalon) to take at bedtime for night-time cough.  Take plain guaifenesin (  extended release tabs such as Mucinex) twice daily, with plenty of water, for cough and congestion.  May add Pseudoephedrine ( , one or two every 4 to 6 hours) for sinus  congestion.  Get adequate rest.   May use Afrin nasal spray (or generic oxymetazoline) twice daily for about 5 days and then discontinue.  Also recommend using saline nasal spray several times daily and saline nasal irrigation (AYR is a common brand).  Use Flonase nasal spray each morning after using Afrin nasal spray and saline nasal irrigation. Try warm salt water gargles for sore throat.  Stop all antihistamines for now, and other non-prescription cough/cold preparations. Begin Azithromycin if not improving about one week or if persistent fever develops (Given a prescription to hold, with an expiration date)  Follow-up with family doctor if not improving about10 days.     Lattie Haw, MD 05/27/15 [phone]

## 2015-05-24 NOTE — Discharge Instructions (Signed)
Take plain guaifenesin (1200mg extended release tabs such as Mucinex) twice daily, with plenty of water, for cough and congestion.  May add Pseudoephedrine (30mg, one or two every 4 to 6 hours) for sinus congestion.  Get adequate rest.   °May use Afrin nasal spray (or generic oxymetazoline) twice daily for about 5 days and then discontinue.  Also recommend using saline nasal spray several times daily and saline nasal irrigation (AYR is a common brand).  Use Flonase nasal spray each morning after using Afrin nasal spray and saline nasal irrigation. °Try warm salt water gargles for sore throat.  °Stop all antihistamines for now, and other non-prescription cough/cold preparations. °Begin Azithromycin if not improving about one week or if persistent fever develops   °Follow-up with family doctor if not improving about10 days.  °

## 2015-05-26 ENCOUNTER — Other Ambulatory Visit: Payer: Self-pay | Admitting: *Deleted

## 2015-05-26 ENCOUNTER — Encounter: Payer: Self-pay | Admitting: Family Medicine

## 2015-05-26 ENCOUNTER — Ambulatory Visit (INDEPENDENT_AMBULATORY_CARE_PROVIDER_SITE_OTHER): Payer: PRIVATE HEALTH INSURANCE | Admitting: Family Medicine

## 2015-05-26 VITALS — BP 105/66 | HR 90 | Temp 98.1°F | Wt 117.0 lb

## 2015-05-26 DIAGNOSIS — J020 Streptococcal pharyngitis: Secondary | ICD-10-CM | POA: Diagnosis not present

## 2015-05-26 DIAGNOSIS — R05 Cough: Secondary | ICD-10-CM

## 2015-05-26 DIAGNOSIS — R059 Cough, unspecified: Secondary | ICD-10-CM

## 2015-05-26 LAB — POCT INFLUENZA A/B
INFLUENZA B, POC: NEGATIVE
Influenza A, POC: NEGATIVE

## 2015-05-26 MED ORDER — ALPRAZOLAM 0.5 MG PO TABS: 0.5000 mg | ORAL_TABLET | Freq: Two times a day (BID) | ORAL | Status: DC | PRN

## 2015-05-26 NOTE — Patient Instructions (Signed)
Thank you for coming in today. Continue the ibuprofen and tylenol.  Return if not better.  Call or go to the emergency room if you get worse, have trouble breathing, have chest pains, or palpitations.   Upper Respiratory Infection, Adult Most upper respiratory infections (URIs) are a viral infection of the air passages leading to the lungs. A URI affects the nose, throat, and upper air passages. The most common type of URI is nasopharyngitis and is typically referred to as "the common cold." URIs run their course and usually go away on their own. Most of the time, a URI does not require medical attention, but sometimes a bacterial infection in the upper airways can follow a viral infection. This is called a secondary infection. Sinus and middle ear infections are common types of secondary upper respiratory infections. Bacterial pneumonia can also complicate a URI. A URI can worsen asthma and chronic obstructive pulmonary disease (COPD). Sometimes, these complications can require emergency medical care and may be life threatening.  CAUSES Almost all URIs are caused by viruses. A virus is a type of germ and can spread from one person to another.  RISKS FACTORS You may be at risk for a URI if:   You smoke.   You have chronic heart or lung disease.  You have a weakened defense (immune) system.   You are very young or very old.   You have nasal allergies or asthma.  You work in crowded or poorly ventilated areas.  You work in health care facilities or schools. SIGNS AND SYMPTOMS  Symptoms typically develop 2-3 days after you come in contact with a cold virus. Most viral URIs last 7-10 days. However, viral URIs from the influenza virus (flu virus) can last 14-18 days and are typically more severe. Symptoms may include:   Runny or stuffy (congested) nose.   Sneezing.   Cough.   Sore throat.   Headache.   Fatigue.   Fever.   Loss of appetite.   Pain in your forehead,  behind your eyes, and over your cheekbones (sinus pain).  Muscle aches.  DIAGNOSIS  Your health care provider may diagnose a URI by:  Physical exam.  Tests to check that your symptoms are not due to another condition such as:  Strep throat.  Sinusitis.  Pneumonia.  Asthma. TREATMENT  A URI goes away on its own with time. It cannot be cured with medicines, but medicines may be prescribed or recommended to relieve symptoms. Medicines may help:  Reduce your fever.  Reduce your cough.  Relieve nasal congestion. HOME CARE INSTRUCTIONS   Take medicines only as directed by your health care provider.   Gargle warm saltwater or take cough drops to comfort your throat as directed by your health care provider.  Use a warm mist humidifier or inhale steam from a shower to increase air moisture. This may make it easier to breathe.  Drink enough fluid to keep your urine clear or pale yellow.   Eat soups and other clear broths and maintain good nutrition.   Rest as needed.   Return to work when your temperature has returned to normal or as your health care provider advises. You may need to stay home longer to avoid infecting others. You can also use a face mask and careful hand washing to prevent spread of the virus.  Increase the usage of your inhaler if you have asthma.   Do not use any tobacco products, including cigarettes, chewing tobacco, or electronic cigarettes.  If you need help quitting, ask your health care provider. PREVENTION  The best way to protect yourself from getting a cold is to practice good hygiene.   Avoid oral or hand contact with people with cold symptoms.   Wash your hands often if contact occurs.  There is no clear evidence that vitamin C, vitamin E, echinacea, or exercise reduces the chance of developing a cold. However, it is always recommended to get plenty of rest, exercise, and practice good nutrition.  SEEK MEDICAL CARE IF:   You are getting  worse rather than better.   Your symptoms are not controlled by medicine.   You have chills.  You have worsening shortness of breath.  You have brown or red mucus.  You have yellow or brown nasal discharge.  You have pain in your face, especially when you bend forward.  You have a fever.  You have swollen neck glands.  You have pain while swallowing.  You have white areas in the back of your throat. SEEK IMMEDIATE MEDICAL CARE IF:   You have severe or persistent:  Headache.  Ear pain.  Sinus pain.  Chest pain.  You have chronic lung disease and any of the following:  Wheezing.  Prolonged cough.  Coughing up blood.  A change in your usual mucus.  You have a stiff neck.  You have changes in your:  Vision.  Hearing.  Thinking.  Mood. MAKE SURE YOU:   Understand these instructions.  Will watch your condition.  Will get help right away if you are not doing well or get worse.   This information is not intended to replace advice given to you by your health care provider. Make sure you discuss any questions you have with your health care provider.   Document Released: 09/14/2000 Document Revised: 08/05/2014 Document Reviewed: 06/26/2013 Elsevier Interactive Patient Education [employer]2016 Elsevier Inc.

## 2015-05-27 NOTE — Progress Notes (Signed)
       [name] is a 40 y.o. female who presents to [name] today for sore throat and body aches headaches. Symptoms present for 2 days. Patient was recently seen in urgent care where she was diagnosed with a viral URI. She has tried Sudafed and some ibuprofen which does help. She was prescribed azithromycin to take if she did not improve. She has not tried the azithromycin yet. She does note a cough that is mildly productive. She denies any vomiting or diarrhea chest pain or shortness of breath. She did not receive a flu vaccine this year. 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   Past Medical History  Diagnosis Date  . Depression     pp  . Abnormal Pap smear   . Hx of varicella   . Frequent UTI   . Kidney stones   . Hyperlipidemia    Past Surgical History  Procedure Laterality Date  . Kidney stone surgery    . Dilation and curettage of uterus    . Leep     Social History  Substance Use Topics  . Smoking status: Former Games developer  . Smokeless tobacco: Never Used  . Alcohol Use: Yes     Comment: Occasional   family history includes Alcohol abuse in her maternal grandfather; Anemia in her brother and mother; CAD in her father; Cancer in her mother and paternal aunt; Diabetes in her father; Hyperlipidemia in her mother; Hypertension in her maternal grandmother.  ROS as above Medications: Current Outpatient Prescriptions  Medication Sig Dispense Refill  . ALPRAZolam (XANAX) 0.5 MG tablet Take 1 tablet (0.5 mg total) by mouth 2 (two) times daily as needed for anxiety. 30 tablet 1  . benzonatate (TESSALON) 200 MG capsule Take 1 capsule (200 mg total) by mouth at bedtime. Take as needed for cough 12 capsule 0  . sertraline (ZOLOFT) 100 MG tablet Take 1 tablet (100 mg total) by mouth daily. 30 tablet 2  . azithromycin (ZITHROMAX Z-PAK) 250 MG tablet Take 2 tabs  today; then begin one tab once daily for 4 more days. (Rx void after 06/01/15) (Patient not taking: Reported on 05/26/2015) 6 tablet 0   No current facility-administered medications for this visit.   No Known Allergies   Exam:  BP 105/66 mmHg  Pulse 90  Temp(Src) 98.1 F (36.7 C) (Oral)  Wt 117 lb (53.071 kg)  SpO2 99%  LMP 05/03/2015 Gen: Well NAD nontoxic appearing HEENT: EOMI,  MMM posterior pharynx and tympanic membranes. Clear nasal discharge. Significant cervical lymphadenopathy present. Lungs: Normal work of breathing. CTABL Heart: RRR no MRG Abd: NABS, Soft. Nondistended, Nontender Exts: Brisk capillary refill, warm and well perfused.   Results for orders placed or performed in visit on 05/26/15 (from the past 24 hour(s))  POCT Influenza A/B     Status: Normal   Collection Time: 05/26/15  3:31 PM  Result Value Ref Range   Influenza A, POC Negative Negative   Influenza B, POC Negative Negative   No results found.   40 year old woman with viral URI. Doubtful for influenza based on negative point-of-care influenza test and absence of fever. Treat symptomatically with over-the-counter medications. Recommend against taking azithromycin. Plan for watchful waiting. Return as needed.

## 2015-06-24 ENCOUNTER — Telehealth: Payer: Self-pay | Admitting: *Deleted

## 2015-06-24 ENCOUNTER — Other Ambulatory Visit: Payer: Self-pay | Admitting: Physician Assistant

## 2015-06-24 MED ORDER — SERTRALINE HCL 100 MG PO TABS: ORAL_TABLET | ORAL | Status: DC

## 2015-06-24 NOTE — Telephone Encounter (Signed)
Patient is requesting a refill on zoloft. We will refill medication for # 30 but I did let patient know that she is due for a f/u. Transferred up to the front to schedule an appointment.  Patient then tells me has been off the medication for 1 week

## 2015-06-30 ENCOUNTER — Ambulatory Visit (INDEPENDENT_AMBULATORY_CARE_PROVIDER_SITE_OTHER): Payer: PRIVATE HEALTH INSURANCE | Admitting: Physician Assistant

## 2015-06-30 ENCOUNTER — Encounter: Payer: Self-pay | Admitting: Physician Assistant

## 2015-06-30 VITALS — BP 88/51 | HR 72 | Ht 65.0 in | Wt 118.0 lb

## 2015-06-30 DIAGNOSIS — G4700 Insomnia, unspecified: Secondary | ICD-10-CM

## 2015-06-30 DIAGNOSIS — F411 Generalized anxiety disorder: Secondary | ICD-10-CM

## 2015-06-30 DIAGNOSIS — F429 Obsessive-compulsive disorder, unspecified: Secondary | ICD-10-CM

## 2015-06-30 MED ORDER — BUSPIRONE HCL 5 MG PO TABS: 5.0000 mg | ORAL_TABLET | Freq: Two times a day (BID) | ORAL | Status: DC

## 2015-06-30 MED ORDER — SERTRALINE HCL 100 MG PO TABS: ORAL_TABLET | ORAL | Status: DC

## 2015-06-30 MED ORDER — ALPRAZOLAM 0.5 MG PO TABS: 0.5000 mg | ORAL_TABLET | Freq: Two times a day (BID) | ORAL | Status: DC | PRN

## 2015-06-30 NOTE — Progress Notes (Signed)
   Subjective:    Patient ID: [name], female    DOB: 10/20/1975, 40 y.o.   MRN: [id number]  HPI  Pt is a 40 yo female who presents to the clinic for 3 month follow up on zoloft and buspar for OCD and anxiety. She is doing much better. She is happy and enjoying life. She has no current obsession. She denies any depression or anxiety. She forgot to take for a week and really felt effects and wants to stay on it.   She is taking xanax at bedtime for sleep. Trazodone made her have crazy dreams and feeling groggy. She is not taking.     Review of Systems  All other systems reviewed and are negative.      Objective:   Physical Exam  Constitutional: She is oriented to person, place, and time. She appears well-developed and well-nourished.  HENT:  Head: Normocephalic and atraumatic.  Cardiovascular: Normal rate, regular rhythm and normal heart sounds.   Pulmonary/Chest: Effort normal and breath sounds normal.  Neurological: She is alert and oriented to person, place, and time.  Psychiatric: She has a normal mood and affect. Her behavior is normal.          Assessment & Plan:  GAD/OCD- PHQ-9 was 2 down from 4 and GAD-7 was 8 down from 21. Continue on zoloft daily and buspar 5mg  bid. Follow up in 6 months.   Per pt [facility]. We also want to check and see if lipid level was checked.   Insomnia- controlled with xanax at bedtime. She does not take during the day.refilled for 6 months.

## 2015-08-13 ENCOUNTER — Encounter: Payer: Self-pay | Admitting: Physician Assistant

## 2015-08-13 ENCOUNTER — Ambulatory Visit (INDEPENDENT_AMBULATORY_CARE_PROVIDER_SITE_OTHER): Payer: PRIVATE HEALTH INSURANCE

## 2015-08-13 ENCOUNTER — Ambulatory Visit (INDEPENDENT_AMBULATORY_CARE_PROVIDER_SITE_OTHER): Payer: PRIVATE HEALTH INSURANCE | Admitting: Physician Assistant

## 2015-08-13 VITALS — BP 96/52 | HR 76 | Ht 65.0 in | Wt 120.0 lb

## 2015-08-13 DIAGNOSIS — M25562 Pain in left knee: Secondary | ICD-10-CM

## 2015-08-13 DIAGNOSIS — M25561 Pain in right knee: Secondary | ICD-10-CM

## 2015-08-13 DIAGNOSIS — M222X2 Patellofemoral disorders, left knee: Secondary | ICD-10-CM

## 2015-08-13 MED ORDER — MELOXICAM 15 MG PO TABS: 15.0000 mg | ORAL_TABLET | Freq: Every day | ORAL | Status: DC

## 2015-08-13 MED ORDER — TRAMADOL HCL 50 MG PO TABS: 50.0000 mg | ORAL_TABLET | Freq: Three times a day (TID) | ORAL | Status: DC | PRN

## 2015-08-13 NOTE — Progress Notes (Addendum)
   Subjective:    Patient ID: [name], female    DOB: 05/31/1975, 40 y.o.   MRN: [id number]  HPI Patient is here today complaining of left knee pain, popping, and swelling. She has had knee trouble for years that has come and gone. For the past couple of weeks, the popping and swelling have occurred much more frequently, even with walking. She also has pain at rest. She denies decreased rang of motion but does have pain with movement. She has not tried any antiinflammatories. She has used ice which does help at times.    She has [facility] which showed deteriorating cartilage. She was given a brace to wear which helped for a while but now does not provide any relief.   Review of Systems  All other systems reviewed and are negative.      Objective:   Physical Exam  Constitutional: She is oriented to person, place, and time. She appears well-developed and well-nourished.  HENT:  Head: Normocephalic and atraumatic.  Eyes: Conjunctivae are normal. Right eye exhibits no discharge. Left eye exhibits no discharge.  Musculoskeletal:       Left knee: She exhibits bony tenderness. She exhibits normal range of motion, no swelling, no effusion, no deformity, no laceration and no erythema. Tenderness (Tender to palpation where the femoral ligaments meet the patella and tenderness over the tibial tuberosity) found.  NROM of left knee. Negative mcmurrays. Negative anterior drawer.  Neurological: She is alert and oriented to person, place, and time.  Skin: Skin is warm and dry.  Psychiatric: She has a normal mood and affect. Her behavior is normal. Thought content normal.          Assessment & Plan:  1. Left knee pain/ Patellofemoral pain syndrome- Patient presents with worsening left knee pain. On PE, she is tender to palpation where the femoral ligaments meet the patella as well as over the tibial tuberosity. Pt has infrapatellar strap. I am  going to order X-ray of the left knee. Sending Mobic and Tramadol for pain relief. Handout given on Patellofemoral syndrome. Will make referral for formal physical therapy. Suggest follow up with sports med to consider injections and further treatment.

## 2015-08-13 NOTE — Addendum Note (Signed)
Addended by: Jomarie LongsBREEBACK, Isobelle Tuckett L on: 08/13/2015 04:27 PM   Modules accepted: Orders

## 2015-09-23 ENCOUNTER — Other Ambulatory Visit: Payer: Self-pay | Admitting: Physician Assistant

## 2015-10-06 ENCOUNTER — Other Ambulatory Visit: Payer: Self-pay | Admitting: Physician Assistant

## 2015-10-08 ENCOUNTER — Other Ambulatory Visit: Payer: Self-pay | Admitting: Obstetrics and Gynecology

## 2015-10-08 LAB — HM PAP SMEAR: HM Pap smear: NEGATIVE

## 2015-10-09 LAB — CYTOLOGY - PAP

## 2015-11-24 ENCOUNTER — Other Ambulatory Visit: Payer: Self-pay | Admitting: *Deleted

## 2015-11-24 MED ORDER — BUSPIRONE HCL 5 MG PO TABS: 5.0000 mg | ORAL_TABLET | Freq: Two times a day (BID) | ORAL | 0 refills | Status: DC

## 2015-12-24 ENCOUNTER — Emergency Department
Admission: EM | Admit: 2015-12-24 | Discharge: 2015-12-24 | Disposition: A | Discharge status: Home / Self Care | Payer: Self-pay | Source: Home / Self Care | Attending: Family Medicine | Admitting: Family Medicine

## 2015-12-24 DIAGNOSIS — J029 Acute pharyngitis, unspecified: Secondary | ICD-10-CM

## 2015-12-24 LAB — POCT RAPID STREP A (OFFICE): RAPID STREP A SCREEN: NEGATIVE

## 2015-12-24 MED ORDER — PREDNISONE 20 MG PO TABS: ORAL_TABLET | ORAL | 0 refills | Status: DC

## 2015-12-24 MED ORDER — AMOXICILLIN 875 MG PO TABS: 875.0000 mg | ORAL_TABLET | Freq: Two times a day (BID) | ORAL | 0 refills | Status: DC

## 2015-12-24 NOTE — ED Provider Notes (Signed)
[facility]    CSN: [id number] Arrival date & time: 12/24/15  1406  First Provider Contact:  First MD Initiated Contact with Patient 12/24/15 1514        History   Chief Complaint Chief Complaint  Patient presents with  . Sore Throat    HPI [name] is a 40 y.o. female.   Patient complains of onset of sinus congestion about one week ago.  Today she developed sore throat, headache, soreness in her left neck, and left earache.  No cough or fever.   The history is provided by the patient.    Past Medical History:  Diagnosis Date  . Abnormal Pap smear   . Depression    pp  . Frequent UTI   . Hx of varicella   . Hyperlipidemia   . Kidney stones     Patient Active Problem List   Diagnosis Date Noted  . Menorrhagia 05/18/2015  . History of HPV infection 03/06/2015  . Cervical dysplasia 03/06/2015  . Insomnia 03/06/2015  . GAD (generalized anxiety disorder) 02/03/2015  . OCD (obsessive compulsive disorder) 02/03/2015  . Chest pain 10/16/2013  . Hyperlipidemia 10/16/2013    Past Surgical History:  Procedure Laterality Date  . DILATION AND CURETTAGE OF UTERUS    . KIDNEY STONE SURGERY    . LEEP      OB History    Gravida Para Term Preterm AB Living   4 3 3   1 3    SAB TAB Ectopic Multiple Live Births   1       3       Home Medications    Prior to Admission medications   Medication Sig Start Date End Date Taking? Authorizing Provider  ALPRAZolam Prudy Feeler(XANAX) 0.5 MG tablet Take 1 tablet (0.5 mg total) by mouth 2 (two) times daily as needed for anxiety. 06/30/15   Jade L Breeback, PA-C  amoxicillin (AMOXIL) 875 MG tablet Take 1 tablet (875 mg total) by mouth 2 (two) times daily. (Rx void after 01/01/16) 12/24/15   Lattie HawStephen A Beese, MD  busPIRone (BUSPAR) 5 MG tablet Take 1 tablet (5 mg total) by mouth 2 (two) times daily. 11/24/15   Jade L Breeback, PA-C  meloxicam (MOBIC) 15 MG tablet TAKE 1 TABLET (15 MG TOTAL) BY MOUTH DAILY. 10/08/15   Jade L  Breeback, PA-C  predniSONE (DELTASONE) 20 MG tablet Take one tab by mouth twice daily for 5 days, then one daily. Take with food. 12/24/15   Lattie HawStephen A Beese, MD  sertraline (ZOLOFT) 100 MG tablet Take one tablet by mouth daily 06/30/15   Jomarie LongsJade L Breeback, PA-C  traMADol (ULTRAM) 50 MG tablet Take 1 tablet (50 mg total) by mouth every 8 (eight) hours as needed. 08/13/15   Jomarie LongsJade L Breeback, PA-C    Family History Family History  Problem Relation Age of Onset  . Diabetes Father   . CAD Father     Died of MI at age 38  . Cancer Paternal Aunt     pancreatic  . Alcohol abuse Maternal Grandfather   . Anemia Mother   . Cancer Mother     bone  . Hyperlipidemia Mother   . Anemia Brother   . Hypertension Maternal Grandmother     Social History Social History  Substance Use Topics  . Smoking status: Former Games developermoker  . Smokeless tobacco: Never Used  . Alcohol use Yes     Comment: Occasional     Allergies   Trazodone  and nefazodone   Review of Systems Review of Systems + sore throat No cough No pleuritic pain No wheezing + nasal congestion + post-nasal drainage No sinus pain/pressure No itchy/red eyes ? earache No hemoptysis No SOB No fever/chills No nausea No vomiting No abdominal pain No diarrhea No urinary symptoms No skin rash + fatigue No myalgias + headache Used OTC meds without relief   Physical Exam Triage Vital Signs ED Triage Vitals  Enc Vitals Group     BP 12/24/15 1456 112/72     Pulse Rate 12/24/15 1456 77     Resp --      Temp 12/24/15 1456 98 F (36.7 C)     Temp Source 12/24/15 1456 Oral     SpO2 12/24/15 1456 100 %     Weight 12/24/15 1457 127 lb 12.8 oz (58 kg)     Height 12/24/15 1457 5\' 6"  (1.676 m)     Head Circumference --      Peak Flow --      Pain Score 12/24/15 1458 6     Pain Loc --      Pain Edu? --      Excl. in GC? --    No data found.   Updated Vital Signs BP 112/72 (BP Location: Left Arm)   Pulse 77   Temp 98 F (36.7  C) (Oral)   Ht 5\' 6"  (1.676 m)   Wt 127 lb 12.8 oz (58 kg)   SpO2 100%   BMI 20.63 kg/m   Visual Acuity Right Eye Distance:   Left Eye Distance:   Bilateral Distance:    Right Eye Near:   Left Eye Near:    Bilateral Near:     Physical Exam Nursing notes and Vital Signs reviewed. Appearance:  Patient appears stated age, and in no acute distress Eyes:  Pupils are equal, round, and reactive to light and accomodation.  Extraocular movement is intact.  Conjunctivae are not inflamed  Ears:  Canals normal.  Tympanic membranes normal.  Nose:  Mildly congested turbinates.  No sinus tenderness.   Pharynx:  Normal Neck:  Supple.  Nontender enlarged posterior/lateral nodes are palpated bilaterally  Lungs:  Clear to auscultation.  Breath sounds are equal.  Moving air well. Heart:  Regular rate and rhythm without murmurs, rubs, or gallops.  Abdomen:  Nontender without masses or hepatosplenomegaly.  Bowel sounds are present.  No CVA or flank tenderness.  Extremities:  No edema.  Skin:  No rash present.    UC Treatments / Results  Labs (all labs ordered are listed, but only abnormal results are displayed) Labs Reviewed  POCT RAPID STREP A (OFFICE) negative    EKG  EKG Interpretation None       Radiology No results found.  Procedures Procedures (including critical care time)  Medications Ordered in UC Medications - No data to display   Initial Impression / Assessment and Plan / UC Course  I have reviewed the triage vital signs and the nursing notes.  Pertinent labs & imaging results that were available during my care of the patient were reviewed by me and considered in my medical decision making (see chart for details).  Clinical Course  There is no evidence of bacterial infection today.   Begin prednisone burst/taper.  If increasing cold symptoms develop, try the following. Take plain guaifenesin (1200mg  extended release tabs such as Mucinex) twice daily, with plenty  of water, for cough and congestion.  May add Pseudoephedrine (30mg , one  or two every 4 to 6 hours) for sinus congestion.  Get adequate rest.   May use Afrin nasal spray (or generic oxymetazoline) twice daily for about 5 days and then discontinue.  Also recommend using saline nasal spray several times daily and saline nasal irrigation (AYR is a common brand).  Use Flonase nasal spray each morning after using Afrin nasal spray and saline nasal irrigation. Try warm salt water gargles for sore throat.  Stop all antihistamines for now, and other non-prescription cough/cold preparations. May take Delsym Cough Suppressant at bedtime for nighttime cough.  Begin Amoxicillin if not improving about one week or if persistent fever develops (Given a prescription to hold, with an expiration date)  Follow-up with family doctor if not improving about10 days.     Final Clinical Impressions(s) / UC Diagnoses   Final diagnoses:  Viral pharyngitis    New Prescriptions New Prescriptions   AMOXICILLIN (AMOXIL) 875 MG TABLET    Take 1 tablet (875 mg total) by mouth 2 (two) times daily. (Rx void after 01/01/16)   PREDNISONE (DELTASONE) 20 MG TABLET    Take one tab by mouth twice daily for 5 days, then one daily. Take with food.     Lattie Haw, MD 01/05/16 1007

## 2015-12-24 NOTE — ED Triage Notes (Signed)
Pt started with sore throat and headache yesterday.  The throat hurts mainly on the left side, and started having slight pain in the left ear today.

## 2015-12-24 NOTE — Discharge Instructions (Signed)
If increasing cold symptoms develop, try the following. Take plain guaifenesin (1200mg  extended release tabs such as Mucinex) twice daily, with plenty of water, for cough and congestion.  May add Pseudoephedrine (30mg , one or two every 4 to 6 hours) for sinus congestion.  Get adequate rest.   May use Afrin nasal spray (or generic oxymetazoline) twice daily for about 5 days and then discontinue.  Also recommend using saline nasal spray several times daily and saline nasal irrigation (AYR is a common brand).  Use Flonase nasal spray each morning after using Afrin nasal spray and saline nasal irrigation. Try warm salt water gargles for sore throat.  Stop all antihistamines for now, and other non-prescription cough/cold preparations. May take Delsym Cough Suppressant at bedtime for nighttime cough.  Begin Amoxicillin if not improving about one week or if persistent fever develops   Follow-up with family doctor if not improving about10 days.

## 2015-12-27 ENCOUNTER — Telehealth: Payer: Self-pay | Admitting: Emergency Medicine

## 2015-12-27 NOTE — Telephone Encounter (Signed)
Spoke with patient and she is doing "much better". 

## 2015-12-30 ENCOUNTER — Ambulatory Visit: Payer: PRIVATE HEALTH INSURANCE | Admitting: Physician Assistant

## 2016-01-01 ENCOUNTER — Ambulatory Visit: Payer: PRIVATE HEALTH INSURANCE | Admitting: Physician Assistant

## 2016-01-01 ENCOUNTER — Other Ambulatory Visit: Payer: Self-pay | Admitting: *Deleted

## 2016-01-01 MED ORDER — SERTRALINE HCL 100 MG PO TABS: ORAL_TABLET | ORAL | 0 refills | Status: DC

## 2016-01-26 ENCOUNTER — Encounter: Payer: Self-pay | Admitting: Physician Assistant

## 2016-01-26 ENCOUNTER — Ambulatory Visit (INDEPENDENT_AMBULATORY_CARE_PROVIDER_SITE_OTHER): Payer: Self-pay | Admitting: Physician Assistant

## 2016-01-26 VITALS — BP 96/56 | HR 79

## 2016-01-26 DIAGNOSIS — R5383 Other fatigue: Secondary | ICD-10-CM

## 2016-01-26 DIAGNOSIS — F411 Generalized anxiety disorder: Secondary | ICD-10-CM

## 2016-01-26 DIAGNOSIS — F429 Obsessive-compulsive disorder, unspecified: Secondary | ICD-10-CM

## 2016-01-26 DIAGNOSIS — R350 Frequency of micturition: Secondary | ICD-10-CM

## 2016-01-26 DIAGNOSIS — Z13220 Encounter for screening for lipoid disorders: Secondary | ICD-10-CM

## 2016-01-26 LAB — POCT URINALYSIS DIPSTICK
BILIRUBIN UA: NEGATIVE
GLUCOSE UA: NEGATIVE
KETONES UA: NEGATIVE
Leukocytes, UA: NEGATIVE
Nitrite, UA: NEGATIVE
PH UA: 7
Protein, UA: NEGATIVE
Spec Grav, UA: 1.02
Urobilinogen, UA: 1

## 2016-01-26 MED ORDER — SERTRALINE HCL 100 MG PO TABS: ORAL_TABLET | ORAL | 3 refills | Status: DC

## 2016-01-26 MED ORDER — BUSPIRONE HCL 5 MG PO TABS: 5.0000 mg | ORAL_TABLET | Freq: Two times a day (BID) | ORAL | 3 refills | Status: DC

## 2016-01-26 NOTE — Progress Notes (Signed)
   Subjective:    Patient ID: [name], female    DOB: 01/07/1976, 40 y.o.   MRN: [id number]  HPI  Pt is a 40 yo female who presents to the clinic for follow up.   OCD/GAD- doing much better. She has no ongoing obcession that she is aware of right now. She is able to handle it with her techniques learned in counseling. She feels great. Her marriage is doing much better. No suicidal or homicidal thoughts.   She would like blood work due to being a little more tired lately. No fever, chills, nights sweats.   She has had some recent urinary frequency. No other problems, flank pain, abdominal pain. She has not tried anything to make better. Not started any new medications. She wants to make sure she does not have an infection. Just finished her menstrual cycle.     Review of Systems See HPI.     Objective:   Physical Exam  Constitutional: She is oriented to person, place, and time. She appears well-developed and well-nourished.  HENT:  Head: Normocephalic and atraumatic.  Cardiovascular: Normal rate, regular rhythm and normal heart sounds.   Pulmonary/Chest: Effort normal and breath sounds normal.  NO CVA tenderness.   Abdominal: Soft. Bowel sounds are normal. She exhibits no distension and no mass. There is no tenderness. There is no rebound and no guarding.  Neurological: She is alert and oriented to person, place, and time.  Psychiatric: She has a normal mood and affect. Her behavior is normal.          Assessment & Plan:  Marland Kitchen.Marland Kitchen.Diagnoses and all orders for this visit:  Obsessive-compulsive disorder, unspecified type -     busPIRone (BUSPAR) 5 MG tablet; Take 1 tablet (5 mg total) by mouth 2 (two) times daily. -     sertraline (ZOLOFT) 100 MG tablet; Take one tablet by mouth daily  GAD (generalized anxiety disorder) -     busPIRone (BUSPAR) 5 MG tablet; Take 1 tablet (5 mg total) by mouth 2 (two) times daily. -     sertraline (ZOLOFT) 100 MG tablet; Take one tablet by mouth  daily  Urinary frequency -     POCT urinalysis dipstick  No energy -     CBC -     Comprehensive metabolic panel -     C-reactive protein -     Ferritin -     Sedimentation rate -     TSH -     Vitamin B12 -     Vit D  25 hydroxy (rtn osteoporosis monitoring)  Screening for lipid disorders -     Lipid panel   GAD/OCD- refills given for one year.   Urinary frequency- trace blood will culture. Reassured patient no sign of infection. Blood is likely left over after menstrual cycle.

## 2016-03-17 ENCOUNTER — Other Ambulatory Visit: Payer: Self-pay | Admitting: Physician Assistant

## 2016-03-17 DIAGNOSIS — F429 Obsessive-compulsive disorder, unspecified: Secondary | ICD-10-CM

## 2016-03-17 DIAGNOSIS — F411 Generalized anxiety disorder: Secondary | ICD-10-CM

## 2016-03-26 ENCOUNTER — Other Ambulatory Visit: Payer: Self-pay | Admitting: Physician Assistant

## 2016-03-26 DIAGNOSIS — F429 Obsessive-compulsive disorder, unspecified: Secondary | ICD-10-CM

## 2016-03-26 DIAGNOSIS — F411 Generalized anxiety disorder: Secondary | ICD-10-CM

## 2016-05-16 ENCOUNTER — Encounter: Payer: Self-pay | Admitting: Osteopathic Medicine

## 2016-05-16 ENCOUNTER — Ambulatory Visit (INDEPENDENT_AMBULATORY_CARE_PROVIDER_SITE_OTHER): Payer: Self-pay | Admitting: Osteopathic Medicine

## 2016-05-16 VITALS — BP 115/64 | HR 77 | Temp 98.6°F | Wt 132.0 lb

## 2016-05-16 DIAGNOSIS — J029 Acute pharyngitis, unspecified: Secondary | ICD-10-CM

## 2016-05-16 LAB — POCT RAPID STREP A (OFFICE): RAPID STREP A SCREEN: NEGATIVE

## 2016-05-16 MED ORDER — LIDOCAINE VISCOUS HCL 2 % MT SOLN: 10.0000 mL | OROMUCOSAL | 0 refills | Status: DC | PRN

## 2016-05-16 NOTE — Patient Instructions (Signed)
Pharyngitis Pharyngitis is redness, pain, and swelling (inflammation) of your pharynx. What are the causes? Pharyngitis is usually caused by infection. Most of the time, these infections are from viruses (viral) and are part of a cold. However, sometimes pharyngitis is caused by bacteria (bacterial). Pharyngitis can also be caused by allergies. Viral pharyngitis may be spread from person to person by coughing, sneezing, and personal items or utensils (cups, forks, spoons, toothbrushes). Bacterial pharyngitis may be spread from person to person by more intimate contact, such as kissing. What are the signs or symptoms? Symptoms of pharyngitis include:  Sore throat.  Tiredness (fatigue).  Low-grade fever.  Headache.  Joint pain and muscle aches.  Skin rashes.  Swollen lymph nodes.  Plaque-like film on throat or tonsils (often seen with bacterial pharyngitis). How is this diagnosed? Your health care provider will ask you questions about your illness and your symptoms. Your medical history, along with a physical exam, is often all that is needed to diagnose pharyngitis. Sometimes, a rapid strep test is done. Other lab tests may also be done, depending on the suspected cause. How is this treated? Viral pharyngitis will usually get better in 3-4 days without the use of medicine. Bacterial pharyngitis is treated with medicines that kill germs (antibiotics). Follow these instructions at home:  Drink enough water and fluids to keep your urine clear or pale yellow.  Only take over-the-counter or prescription medicines as directed by your health care provider:  If you are prescribed antibiotics, make sure you finish them even if you start to feel better.  Do not take aspirin.  Get lots of rest.  Gargle with 8 oz of salt water ( tsp of salt per 1 qt of water) as often as every 1-2 hours to soothe your throat.  Throat lozenges (if you are not at risk for choking) or sprays may be used to  soothe your throat. Contact a health care provider if:  You have large, tender lumps in your neck.  You have a rash.  You cough up green, yellow-brown, or bloody spit. Get help right away if:  Your neck becomes stiff.  You drool or are unable to swallow liquids.  You vomit or are unable to keep medicines or liquids down.  You have severe pain that does not go away with the use of recommended medicines.  You have trouble breathing (not caused by a stuffy nose). This information is not intended to replace advice given to you by your health care provider. Make sure you discuss any questions you have with your health care provider. Document Released: 03/21/2005 Document Revised: 08/27/2015 Document Reviewed: 11/26/2012 Elsevier Interactive Patient Education  2017 [employer].    Note: the following list assumes no pregnancy, normal liver & kidney function and no other drug interactions. Dr. Lyn Hollingshead has highlighted medications which are safe for you to use, but these may not be appropriate for everyone. Always ask a pharmacist or qualified medical provider if there are any questions!    Aches/Pains, Fever Acetaminophen (Tylenol) 500 mg tablets - take max 2 tablets (1000 mg) every 6 hours (4 times per day)  Ibuprofen (Motrin) 200 mg tablets - take max 4 tablets (800 mg) every 6 hours  Sinus Congestion Prescription Atrovent Cromolyn Nasal Spray (NasalCrom) 1 spray each nostril 3-4 times per day, max 6 imes per day Nasal Saline if desired Oxymetolazone (Afrin, others) sparing use due to rebound congestion Phenylephrine (Sudafed) 10 mg tablets every 4 hours (or the 12-hour formulation) Diphenhydramine (  Benadryl) 25 mg tablets - take max 2 tablets every 4 hours  Cough & Sore Throat Prescription cough pills or syrups Dextromethorphan (Robitussin, others) - cough suppressant Guaifenesin (Robitussin, Mucinex, others) - expectorant (helps cough up mucus) (Dextromethorphan and  Guaifenesin also come in a combination tablet) Lozenges w/ Benzocaine + Menthol (Cepacol) Honey - as much as you want! Teas which "coat the throat" - look for ingredients Elm Bark, Licorice Root, Marshmallow Root  Other Zinc Lozenges within 24 hours of symptoms onset - mixed evidence this shortens the duration of the common cold Don't waste your money on Vitamin C or Echinacea

## 2016-05-16 NOTE — Progress Notes (Signed)
HPI: [name] is a 41 y.o. female who [facility] [address] [facility] for chief complaint of: No chief complaint on file.  Acute Illness: . Context:  Daughter has flu . Location/Quality: sore throat, scrachy throat . Assoc signs/symptoms: see ROS - no GI or other respiratory symptoms . Duration: 3 days . Modifying factors: has tried the following OTC/Rx medications: none   Past medical, social and family history reviewed.   Immune compromising conditions or other risk factors: none  Current medications and allergies reviewed.     Review of Systems:  Constitutional: No  fever/chills  HEENT: No  headache, Yes  sore throat, No  swollen glands  Cardiovascular: No chest pain  Respiratory:No  cough, No  shortness of breath  Gastrointestinal: No  nausea, No  vomiting,  No  diarrhea  Musculoskeletal:   mild myalgia/arthralgia  Skin/Integument:  No  rash   Detailed Exam:  BP 115/64   Pulse 77   Temp 98.6 F (37 C) (Oral)   Wt 132 lb (59.9 kg)   BMI 21.31 kg/m   Constitutional:   VSS, see above.   General Appearance: alert, well-developed, well-nourished, NAD  Eyes:   Normal lids and conjunctive, non-icteric sclera  Ears, Nose, Mouth, Throat:   Normal external inspection ears/nares  Normal mouth/lips/gums, MMM  normal TM  posterior pharynx without erythema, without exudate  nasal mucosa normal  Skin:  Normal inspection, no rash or concerning lesions noted on limited exam  Neck:   No masses, trachea midline. normal lymph nodes  Respiratory:   Normal respiratory effort.   No  wheeze/rhonchi/rales  Cardiovascular:   S1/S2 normal, no murmur/rub/gallop auscultated. RRR.   Results for orders placed or performed in visit on 05/16/16 (from the past 72 hour(s))  POCT rapid strep A     Status: None   Collection Time: 05/16/16  4:03 PM  Result Value Ref Range   Rapid Strep A Screen Negative Negative   MODIFIED  CENTOR CRITERIA (ponts if "yes"): Tonsillar exudate (1): no Tender Ant Cervical LN (1): no Absence of cough (1): yes Fever (1): no Age  30-14 (1): no 15-45 (0): yes >/= 45 (-1): no SCORE: 1     ASSESSMENT/PLAN: Supportive care, see pt printed instructions, highlighted portions of over-the-counter medication list for lozenges, honey, teas.  Viral pharyngitis - Plan: Lidocaine HCl 2 % SOLN  Sore throat - Plan: POCT rapid strep A  Visit summary was printed for the patient with medications and pertinent instructions for patient to review. ER/RTC precautions reviewed. All questions answered. Return if symptoms worsen or fail to improve.

## 2016-05-18 ENCOUNTER — Telehealth: Payer: Self-pay

## 2016-05-18 NOTE — Telephone Encounter (Signed)
Kendra called and reports she still has a sore throat, dry cough, headaches and fever of 99.2. She is concerned it still may be strep. I advised her this still could just be viral and she could do warm salt water gargle. [facility]

## 2016-05-18 NOTE — Telephone Encounter (Signed)
Ok to send augmentin 1 po bid #20 for 10 days. Give symptoms 24 more hours if no improvement start.

## 2016-05-19 ENCOUNTER — Other Ambulatory Visit: Payer: Self-pay | Admitting: *Deleted

## 2016-05-19 MED ORDER — AMOXICILLIN-POT CLAVULANATE 875-125 MG PO TABS: 1.0000 | ORAL_TABLET | Freq: Two times a day (BID) | ORAL | 0 refills | Status: DC

## 2016-05-19 NOTE — Telephone Encounter (Signed)
Rx sent to pharmacy   

## 2016-10-06 ENCOUNTER — Ambulatory Visit (INDEPENDENT_AMBULATORY_CARE_PROVIDER_SITE_OTHER): Payer: Self-pay | Admitting: Physician Assistant

## 2016-10-06 ENCOUNTER — Encounter: Payer: Self-pay | Admitting: Physician Assistant

## 2016-10-06 VITALS — BP 101/67 | HR 85 | Temp 98.2°F | Ht 65.0 in | Wt 138.0 lb

## 2016-10-06 DIAGNOSIS — J019 Acute sinusitis, unspecified: Secondary | ICD-10-CM

## 2016-10-06 MED ORDER — IPRATROPIUM BROMIDE 0.06 % NA SOLN: 1.0000 | Freq: Four times a day (QID) | NASAL | 0 refills | Status: DC | PRN

## 2016-10-06 MED ORDER — PHENYLEPHRINE HCL 10 MG PO TABS: 10.0000 mg | ORAL_TABLET | Freq: Three times a day (TID) | ORAL | 0 refills | Status: DC

## 2016-10-06 MED ORDER — BENZOCAINE-MENTHOL 10-2.1 MG MT LOZG: LOZENGE | OROMUCOSAL | 0 refills | Status: DC

## 2016-10-06 NOTE — Patient Instructions (Signed)

## 2016-10-06 NOTE — Progress Notes (Signed)
HPI:                                                                [name] is a 41 y.o. female who [facility] today for sinus congestion  Sinus Problem  This is a new problem. The current episode started in the past 7 days (x 5 days). The problem is unchanged. There has been no fever. Associated symptoms include congestion, sinus pressure and a sore throat. Pertinent negatives include no chills, coughing, diaphoresis, ear pain, headaches, hoarse voice, neck pain or shortness of breath. Past treatments include nothing.     Past Medical History:  Diagnosis Date  . Abnormal Pap smear   . Depression    pp  . Frequent UTI   . Hx of varicella   . Hyperlipidemia   . Kidney stones    Past Surgical History:  Procedure Laterality Date  . DILATION AND CURETTAGE OF UTERUS    . KIDNEY STONE SURGERY    . LEEP     Social History  Substance Use Topics  . Smoking status: Former Games developermoker  . Smokeless tobacco: Never Used  . Alcohol use Yes     Comment: Occasional   family history includes Alcohol abuse in her maternal grandfather; Anemia in her brother and mother; CAD in her father; Cancer in her mother and paternal aunt; Diabetes in her father; Hyperlipidemia in her mother; Hypertension in her maternal grandmother.  ROS: negative except as noted in the HPI  Medications: Current Outpatient Prescriptions  Medication Sig Dispense Refill  . ALPRAZolam (XANAX) 0.5 MG tablet Take 1 tablet (0.5 mg total) by mouth 2 (two) times daily as needed for anxiety. 30 tablet 5  . busPIRone (BUSPAR) 5 MG tablet Take 1 tablet (5 mg total) by mouth 2 (two) times daily. 180 tablet 3  . Fe Bisgly-Succ-C-Thre-B12-FA (IRON-150 PO) Take by mouth.    . sertraline (ZOLOFT) 100 MG tablet TAKE 1 TABLET BY MOUTH EVERY DAY 30 tablet 0  . Benzocaine-Menthol (CEPACOL SORE THROAT) 10-2.1 MG LOZG Use as directed 1 each 0  . ipratropium (ATROVENT) 0.06 % nasal  spray Place 1 spray into both nostrils 4 (four) times daily as needed. 15 mL 0  . phenylephrine (SUDAFED PE) 10 MG TABS tablet Take 1 tablet (10 mg total) by mouth every 8 (eight) hours. 30 tablet 0   No current facility-administered medications for this visit.    Allergies  Allergen Reactions  . Trazodone And Nefazodone     Bad nightmares and feeling groggy.       Objective:  BP 101/67   Pulse 85   Temp 98.2 F (36.8 C) (Oral)   Ht 5\' 5"  (1.651 m)   Wt 138 lb (62.6 kg)   BMI 22.96 kg/m  Gen: well-groomed, cooperative, not ill-appearing, no distress HEENT: normal conjunctiva, left TM clear, right TM slightly opaque, nasal mucosa edematous, oropharynx clear, moist mucus membranes, there is diffuse frontal and maxillary sinus tenderness, neck supple, trachea midline Pulm: Normal work of breathing, normal phonation, clear to auscultation bilaterally, no wheezes, rales or rhonchi CV: Normal rate, regular rhythm, s1 and s2 distinct, no murmurs, clicks or rubs  Neuro: alert and oriented x 3, EOM's intact, no  tremor MSK: moving all extremities, normal gait and station Lymph: no cervical or tonsillar adenopathy Skin: warm, dry, intact; no rashes or lesions on exposed skin, no cyanosis   No results found for this or any previous visit (from the past 72 hour(s)). No results found.    Assessment and Plan: 41 y.o. female with   1. Acute non-recurrent sinusitis, unspecified location - symptoms present for 5 days, likely viral. Symptomatic management - ipratropium (ATROVENT) 0.06 % nasal spray; Place 1 spray into both nostrils 4 (four) times daily as needed.  Dispense: 15 mL; Refill: 0 - phenylephrine (SUDAFED PE) 10 MG TABS tablet; Take 1 tablet (10 mg total) by mouth every 8 (eight) hours.  Dispense: 30 tablet; Refill: 0 - Benzocaine-Menthol (CEPACOL SORE THROAT) 10-2.1 MG LOZG; Use as directed  Dispense: 1 each; Refill: 0   Patient education and anticipatory guidance  given Patient agrees with treatment plan Follow-up as needed if symptoms worsen or fail to improve  Levonne Hubert PA-C

## 2016-11-03 ENCOUNTER — Encounter (HOSPITAL_COMMUNITY): Payer: Self-pay

## 2016-11-03 ENCOUNTER — Ambulatory Visit (HOSPITAL_COMMUNITY): Admit: 2016-11-03 | Payer: Self-pay | Admitting: Obstetrics and Gynecology

## 2016-11-03 SURGERY — HYSTERECTOMY, VAGINAL, LAPAROSCOPY-ASSISTED, WITH SALPINGECTOMY
Anesthesia: General

## 2016-11-16 ENCOUNTER — Other Ambulatory Visit: Payer: Self-pay | Admitting: Physician Assistant

## 2016-11-16 DIAGNOSIS — J019 Acute sinusitis, unspecified: Secondary | ICD-10-CM

## 2016-12-06 ENCOUNTER — Telehealth: Payer: Self-pay | Admitting: *Deleted

## 2016-12-06 NOTE — Telephone Encounter (Signed)
Patient called and states she has had congestion and headache since sat. She has been taking Sudafed since Sunday night. She denies fever only congestion,headache and coughing up phlegm. Advised she can add mucinex to regimen. If not better within the week or develop new or worsening sxs to call us back and schedule an appt.

## 2017-01-16 ENCOUNTER — Other Ambulatory Visit: Payer: Self-pay | Admitting: *Deleted

## 2017-01-16 DIAGNOSIS — F411 Generalized anxiety disorder: Secondary | ICD-10-CM

## 2017-01-16 DIAGNOSIS — F429 Obsessive-compulsive disorder, unspecified: Secondary | ICD-10-CM

## 2017-01-16 MED ORDER — SERTRALINE HCL 100 MG PO TABS: 100.0000 mg | ORAL_TABLET | Freq: Every day | ORAL | 0 refills | Status: DC

## 2017-04-23 ENCOUNTER — Other Ambulatory Visit: Payer: Self-pay | Admitting: Physician Assistant

## 2017-04-23 DIAGNOSIS — F411 Generalized anxiety disorder: Secondary | ICD-10-CM

## 2017-04-23 DIAGNOSIS — F429 Obsessive-compulsive disorder, unspecified: Secondary | ICD-10-CM

## 2017-04-24 ENCOUNTER — Ambulatory Visit (INDEPENDENT_AMBULATORY_CARE_PROVIDER_SITE_OTHER): Payer: Self-pay | Admitting: Physician Assistant

## 2017-04-24 ENCOUNTER — Encounter: Payer: Self-pay | Admitting: Physician Assistant

## 2017-04-24 VITALS — BP 101/66 | HR 85 | Temp 98.1°F | Wt 144.0 lb

## 2017-04-24 DIAGNOSIS — J029 Acute pharyngitis, unspecified: Secondary | ICD-10-CM

## 2017-04-24 DIAGNOSIS — J069 Acute upper respiratory infection, unspecified: Secondary | ICD-10-CM

## 2017-04-24 LAB — POCT RAPID STREP A (OFFICE): RAPID STREP A SCREEN: NEGATIVE

## 2017-04-24 MED ORDER — IPRATROPIUM BROMIDE 0.06 % NA SOLN: 2.0000 | Freq: Three times a day (TID) | NASAL | 0 refills | Status: DC | PRN

## 2017-04-24 MED ORDER — BENZONATATE 200 MG PO CAPS: 200.0000 mg | ORAL_CAPSULE | Freq: Three times a day (TID) | ORAL | 0 refills | Status: DC | PRN

## 2017-04-24 NOTE — Progress Notes (Signed)
10/1

## 2017-04-24 NOTE — Patient Instructions (Addendum)
-   Cepacol throat lozenges - warm salt water gargles - tylenol 1000 mg every 8 hours as needed for sore throat - atrovent 2 sprays three times daily as needed for congestion - tessalon every 8 hours as needed for cough - drink at least 11 glasses of water/clear liquids  Upper Respiratory Infection, Adult Most upper respiratory infections (URIs) are caused by a virus. A URI affects the nose, throat, and upper air passages. The most common type of URI is often called "the common cold." Follow these instructions at home:  Take medicines only as told by your doctor.  Gargle warm saltwater or take cough drops to comfort your throat as told by your doctor.  Use a warm mist humidifier or inhale steam from a shower to increase air moisture. This may make it easier to breathe.  Drink enough fluid to keep your pee (urine) clear or pale yellow.  Eat soups and other clear broths.  Have a healthy diet.  Rest as needed.  Go back to work when your fever is gone or your doctor says it is okay. ? You may need to stay home longer to avoid giving your URI to others. ? You can also wear a face mask and wash your hands often to prevent spread of the virus.  Use your inhaler more if you have asthma.  Do not use any tobacco products, including cigarettes, chewing tobacco, or electronic cigarettes. If you need help quitting, ask your doctor. Contact a doctor if:  You are getting worse, not better.  Your symptoms are not helped by medicine.  You have chills.  You are getting more short of breath.  You have brown or red mucus.  You have yellow or brown discharge from your nose.  You have pain in your face, especially when you bend forward.  You have a fever.  You have puffy (swollen) neck glands.  You have pain while swallowing.  You have white areas in the back of your throat. Get help right away if:  You have very bad or constant: ? Headache. ? Ear pain. ? Pain in your forehead,  behind your eyes, and over your cheekbones (sinus pain). ? Chest pain.  You have long-lasting (chronic) lung disease and any of the following: ? Wheezing. ? Long-lasting cough. ? Coughing up blood. ? A change in your usual mucus.  You have a stiff neck.  You have changes in your: ? Vision. ? Hearing. ? Thinking. ? Mood. This information is not intended to replace advice given to you by your health care provider. Make sure you discuss any questions you have with your health care provider. Document Released: 09/07/2007 Document Revised: 11/22/2015 Document Reviewed: 06/26/2013 Elsevier Interactive Patient Education  2018 [employer]

## 2017-04-24 NOTE — Progress Notes (Signed)
HPI:                                                                [name] is a 42 y.o. female who presents to [name] today for URI symptoms  URI   This is a new problem. The current episode started in the past 7 days (x 4 days). There has been no fever. Associated symptoms include congestion, coughing, sneezing and a sore throat. Treatments tried: [facility] severe cold.  She is concerned it could be Strep throat. Reports children at home are sick, but improving.    No flowsheet data found.  No flowsheet data found.    Past Medical History:  Diagnosis Date  . Abnormal Pap smear   . Depression    pp  . Frequent UTI   . Hx of varicella   . Hyperlipidemia   . Kidney stones    Past Surgical History:  Procedure Laterality Date  . DILATION AND CURETTAGE OF UTERUS    . KIDNEY STONE SURGERY    . LEEP     Social History   Tobacco Use  . Smoking status: Former Games developer  . Smokeless tobacco: Never Used  Substance Use Topics  . Alcohol use: Yes    Comment: Occasional   family history includes Alcohol abuse in her maternal grandfather; Anemia in her brother and mother; CAD in her father; Cancer in her mother and paternal aunt; Diabetes in her father; Hyperlipidemia in her mother; Hypertension in her maternal grandmother.    ROS: negative except as noted in the HPI  Medications: Current Outpatient Medications  Medication Sig Dispense Refill  . ALPRAZolam (XANAX) 0.5 MG tablet Take 1 tablet (0.5 mg total) by mouth 2 (two) times daily as needed for anxiety. 30 tablet 5  . benzonatate (TESSALON) 200 MG capsule Take 1 capsule (200 mg total) by mouth 3 (three) times daily as needed for cough. 30 capsule 0  . busPIRone (BUSPAR) 5 MG tablet Take 1 tablet (5 mg total) by mouth 2 (two) times daily. 180 tablet 3  . Fe Bisgly-Succ-C-Thre-B12-FA (IRON-150 PO) Take by mouth.    Marland Kitchen ipratropium (ATROVENT) 0.06 % nasal spray Place 2 sprays  into both nostrils 3 (three) times daily as needed. 15 mL 0  . sertraline (ZOLOFT) 100 MG tablet Take 1 tablet (100 mg total) by mouth daily. 90 tablet 0   No current facility-administered medications for this visit.    Allergies  Allergen Reactions  . Trazodone And Nefazodone     Bad nightmares and feeling groggy.       Objective:  BP 101/66   Pulse 85   Temp 98.1 F (36.7 C) (Oral)   Wt 144 lb (65.3 kg)   BMI 23.96 kg/m  Gen:  alert, not ill-appearing, no distress, appropriate for age HEENT: head normocephalic without obvious abnormality, conjunctiva and cornea clear, oropharynx clear without erythema or edema, no tonsillar exudates, no sinus tenderness, neck supple, no adenopathy, trachea midline Pulm: Normal work of breathing, normal phonation, clear to auscultation bilaterally, no wheezes, rales or rhonchi CV: Normal rate, regular rhythm, s1 and s2 distinct, no murmurs, clicks or rubs  Neuro: alert and oriented x 3, no tremor MSK: extremities atraumatic, normal gait and  station Skin: intact, no rashes on exposed skin, no jaundice, no cyanosis    Results for orders placed or performed in visit on 04/24/17 (from the past 72 hour(s))  POCT rapid strep A     Status: Normal   Collection Time: 04/24/17  4:04 PM  Result Value Ref Range   Rapid Strep A Screen Negative Negative   No results found.    Assessment and Plan: 42 y.o. female with   1. Acute upper respiratory infection - symptomatic management - ipratropium (ATROVENT) 0.06 % nasal spray; Place 2 sprays into both nostrils 3 (three) times daily as needed.  Dispense: 15 mL; Refill: 0 - benzonatate (TESSALON) 200 MG capsule; Take 1 capsule (200 mg total) by mouth 3 (three) times daily as needed for cough.  Dispense: 30 capsule; Refill: 0  2. Sore throat - POCT rapid strep A negative   Patient education and anticipatory guidance given Patient agrees with treatment plan Follow-up as needed if symptoms worsen or  fail to improve  Levonne Hubertharley E. Tawonna Esquer PA-C

## 2017-04-27 ENCOUNTER — Telehealth: Payer: Self-pay | Admitting: Physician Assistant

## 2017-04-27 NOTE — Telephone Encounter (Signed)
Patient called 2:30pm left voicemail on front office phone adv she called at 10:30 this morning requesting an antibiotic she was seen Monday and is not any better and has not heard anything back from anyone regarding getting an antibiotic. Pt was given Tessalon and a Nasal Spray. Req a nurse to call her back

## 2017-04-28 NOTE — Telephone Encounter (Signed)
Pt notified -EH/RMA  

## 2017-04-28 NOTE — Telephone Encounter (Signed)
Please advise -EH/RMA  

## 2017-04-28 NOTE — Telephone Encounter (Signed)
No antibiotic indicated This is a viral upper respiratory infection She can follow-up with her PCP

## 2017-06-01 ENCOUNTER — Other Ambulatory Visit: Payer: Self-pay | Admitting: Obstetrics and Gynecology

## 2017-06-01 DIAGNOSIS — Z1231 Encounter for screening mammogram for malignant neoplasm of breast: Secondary | ICD-10-CM

## 2017-06-21 ENCOUNTER — Ambulatory Visit
Admission: RE | Admit: 2017-06-21 | Discharge: 2017-06-21 | Disposition: A | Discharge status: Home / Self Care | Payer: No Typology Code available for payment source | Source: Ambulatory Visit | Attending: Obstetrics and Gynecology | Admitting: Obstetrics and Gynecology

## 2017-06-21 DIAGNOSIS — Z1231 Encounter for screening mammogram for malignant neoplasm of breast: Secondary | ICD-10-CM

## 2017-06-22 ENCOUNTER — Other Ambulatory Visit: Payer: Self-pay | Admitting: Obstetrics and Gynecology

## 2017-06-22 DIAGNOSIS — R928 Other abnormal and inconclusive findings on diagnostic imaging of breast: Secondary | ICD-10-CM

## 2017-06-23 ENCOUNTER — Ambulatory Visit
Admission: RE | Admit: 2017-06-23 | Discharge: 2017-06-23 | Disposition: A | Discharge status: Home / Self Care | Payer: No Typology Code available for payment source | Source: Ambulatory Visit | Attending: Obstetrics and Gynecology | Admitting: Obstetrics and Gynecology

## 2017-06-23 DIAGNOSIS — R928 Other abnormal and inconclusive findings on diagnostic imaging of breast: Secondary | ICD-10-CM

## 2017-06-27 ENCOUNTER — Other Ambulatory Visit: Payer: No Typology Code available for payment source

## 2017-07-11 ENCOUNTER — Ambulatory Visit: Payer: Self-pay

## 2017-08-08 ENCOUNTER — Other Ambulatory Visit: Payer: Self-pay | Admitting: Physician Assistant

## 2017-08-08 DIAGNOSIS — F411 Generalized anxiety disorder: Secondary | ICD-10-CM

## 2017-08-08 DIAGNOSIS — F429 Obsessive-compulsive disorder, unspecified: Secondary | ICD-10-CM

## 2017-11-27 ENCOUNTER — Other Ambulatory Visit: Payer: Self-pay | Admitting: Physician Assistant

## 2017-11-27 DIAGNOSIS — F429 Obsessive-compulsive disorder, unspecified: Secondary | ICD-10-CM

## 2017-11-27 DIAGNOSIS — F411 Generalized anxiety disorder: Secondary | ICD-10-CM

## 2017-12-11 ENCOUNTER — Encounter: Payer: Self-pay | Admitting: Physician Assistant

## 2017-12-11 ENCOUNTER — Ambulatory Visit (INDEPENDENT_AMBULATORY_CARE_PROVIDER_SITE_OTHER): Payer: Self-pay | Admitting: Physician Assistant

## 2017-12-11 ENCOUNTER — Other Ambulatory Visit: Payer: Self-pay | Admitting: Physician Assistant

## 2017-12-11 VITALS — BP 106/69 | HR 72 | Temp 98.0°F | Ht 65.0 in | Wt 144.0 lb

## 2017-12-11 DIAGNOSIS — F429 Obsessive-compulsive disorder, unspecified: Secondary | ICD-10-CM

## 2017-12-11 DIAGNOSIS — F32A Depression, unspecified: Secondary | ICD-10-CM | POA: Insufficient documentation

## 2017-12-11 DIAGNOSIS — F329 Major depressive disorder, single episode, unspecified: Secondary | ICD-10-CM | POA: Insufficient documentation

## 2017-12-11 DIAGNOSIS — J0140 Acute pansinusitis, unspecified: Secondary | ICD-10-CM

## 2017-12-11 DIAGNOSIS — F411 Generalized anxiety disorder: Secondary | ICD-10-CM

## 2017-12-11 DIAGNOSIS — F325 Major depressive disorder, single episode, in full remission: Secondary | ICD-10-CM

## 2017-12-11 MED ORDER — VILAZODONE HCL 20 MG PO TABS: ORAL_TABLET | ORAL | 2 refills | Status: DC

## 2017-12-11 MED ORDER — AZITHROMYCIN 250 MG PO TABS: ORAL_TABLET | ORAL | 0 refills | Status: DC

## 2017-12-11 NOTE — Patient Instructions (Addendum)
Sinusitis, Adult Sinusitis is soreness and inflammation of your sinuses. Sinuses are hollow spaces in the bones around your face. They are located:  Around your eyes.  In the middle of your forehead.  Behind your nose.  In your cheekbones.  Your sinuses and nasal passages are lined with a stringy fluid (mucus). Mucus normally drains out of your sinuses. When your nasal tissues get inflamed or swollen, the mucus can get trapped or blocked so air cannot flow through your sinuses. This lets bacteria, viruses, and funguses grow, and that leads to infection. Follow these instructions at home: Medicines  Take, use, or apply over-the-counter and prescription medicines only as told by your doctor. These may include nasal sprays.  If you were prescribed an antibiotic medicine, take it as told by your doctor. Do not stop taking the antibiotic even if you start to feel better. Hydrate and Humidify  Drink enough water to keep your pee (urine) clear or pale yellow.  Use a cool mist humidifier to keep the humidity level in your home above 50%.  Breathe in steam for 10-15 minutes, 3-4 times a day or as told by your doctor. You can do this in the bathroom while a hot shower is running.  Try not to spend time in cool or dry air. Rest  Rest as much as possible.  Sleep with your head raised (elevated).  Make sure to get enough sleep each night. General instructions  Put a warm, moist washcloth on your face 3-4 times a day or as told by your doctor. This will help with discomfort.  Wash your hands often with soap and water. If there is no soap and water, use hand sanitizer.  Do not smoke. Avoid being around people who are smoking (secondhand smoke).  Keep all follow-up visits as told by your doctor. This is important. Contact a doctor if:  You have a fever.  Your symptoms get worse.  Your symptoms do not get better within 10 days. Get help right away if:  You have a very bad  headache.  You cannot stop throwing up (vomiting).  You have pain or swelling around your face or eyes.  You have trouble seeing.  You feel confused.  Your neck is stiff.  You have trouble breathing. This information is not intended to replace advice given to you by your health care provider. Make sure you discuss any questions you have with your health care provider. Document Released: 09/07/2007 Document Revised: 11/15/2015 Document Reviewed: 01/14/2015 Elsevier Interactive Patient Education  2018 [employer].  Take 1/2 zoloft and 1/2 viibryd for 1-2 weeks then stop zoloft and start full tablet of viibryd.

## 2017-12-11 NOTE — Progress Notes (Signed)
Subjective:    Patient ID: [name], female    DOB: December 13, 1975, 42 y.o.   MRN: [id number]  HPI  Pt is a 42 yo female with Depression, OCD, GAD who presents to the clinic for almost 2 weeks of sinus pressure, congestion, headaches, ear popping. Started with eye itching and sneezing but has progressed despite taking Allegra, decongestants, mucinex, flonase. No fever, chills, body aches.   Taking zoloft for mood and doing wonderful; however, she has no sex drive. She needs to try something else. She is not taking buspar but claims anxiety is not a problem.   .. Active Ambulatory Problems    Diagnosis Date Noted  . Chest pain 10/16/2013  . Hyperlipidemia 10/16/2013  . GAD (generalized anxiety disorder) 02/03/2015  . OCD (obsessive compulsive disorder) 02/03/2015  . History of HPV infection 03/06/2015  . Cervical dysplasia 03/06/2015  . Insomnia 03/06/2015  . Menorrhagia 05/18/2015  . Depression 12/11/2017   Resolved Ambulatory Problems    Diagnosis Date Noted  . No Resolved Ambulatory Problems   Past Medical History:  Diagnosis Date  . Abnormal Pap smear   . Frequent UTI   . Hx of varicella   . Kidney stones        Review of Systems See HPI.     Objective:   Physical Exam  Constitutional: She is oriented to person, place, and time. She appears well-developed and well-nourished.  HENT:  Head: Normocephalic and atraumatic.  Right Ear: External ear normal.  Left Ear: External ear normal.  Mouth/Throat: Oropharynx is clear and moist.  TM:s clear bilaterally.  Oropharynx erythematous.  Tenderness over maxillary and frontal sinuses.   Eyes: Pupils are equal, round, and reactive to light. Conjunctivae and EOM are normal.  Neck: Normal range of motion. Neck supple.  Cardiovascular: Normal rate and regular rhythm.  Pulmonary/Chest: Effort normal and breath sounds normal.  Lymphadenopathy:    She has no cervical adenopathy.  Neurological: She is alert and oriented to  person, place, and time.  Psychiatric: She has a normal mood and affect. Her behavior is normal.          Assessment & Plan:  Marland KitchenMarland KitchenDiagnoses and all orders for this visit:  Acute non-recurrent pansinusitis -     azithromycin (ZITHROMAX) 250 MG tablet; Take 2 tablets now and then one tablet for 4 days.  Major depressive disorder in full remission, unspecified whether recurrent (HCC) -     Vilazodone HCl 20 MG TABS; Take 1/2 tablet for 7 days then increase to 1 full tablet.  Obsessive-compulsive disorder, unspecified type -     Vilazodone HCl 20 MG TABS; Take 1/2 tablet for 7 days then increase to 1 full tablet.  GAD (generalized anxiety disorder) -     Vilazodone HCl 20 MG TABS; Take 1/2 tablet for 7 days then increase to 1 full tablet.   .. Depression screen [facility] 2/9 12/11/2017  Decreased Interest 0  Down, Depressed, Hopeless 0  PHQ - 2 Score 0  Altered sleeping 0  Tired, decreased energy 0  Change in appetite 0  Feeling bad or failure about yourself  0  Trouble concentrating 0  Moving slowly or fidgety/restless 0  Suicidal thoughts 0  PHQ-9 Score 0  Difficult doing work/chores Not difficult at all   .Marland Kitchen GAD 7 : Generalized Anxiety Score 12/11/2017  Nervous, Anxious, on Edge 0  Control/stop worrying 0  Worry too much - different things 1  Trouble relaxing 0  Restless 0  Afraid -  awful might happen 0  Anxiety Difficulty Not difficult at all    viibryd has no sexual side effects. Taper off zoloft and start viibryd.  Follow up in 3 months.   zpak given for sinus infection. If not improving significantly in 3 days consider adding medrol dose pack. HO given.

## 2017-12-12 MED ORDER — BUPROPION HCL ER (XL) 150 MG PO TB24: 150.0000 mg | ORAL_TABLET | ORAL | 2 refills | Status: DC

## 2017-12-13 ENCOUNTER — Encounter: Payer: Self-pay | Admitting: Physician Assistant

## 2017-12-13 MED ORDER — METHYLPREDNISOLONE 4 MG PO TBPK: ORAL_TABLET | ORAL | 0 refills | Status: DC

## 2017-12-13 NOTE — Addendum Note (Signed)
Addended by: Jomarie Longs on: 12/13/2017 09:19 AM   Modules accepted: Orders

## 2017-12-15 MED ORDER — AZELASTINE HCL 0.05 % OP SOLN: 1.0000 [drp] | Freq: Two times a day (BID) | OPHTHALMIC | 11 refills | Status: DC

## 2017-12-15 NOTE — Addendum Note (Signed)
Addended by: Jomarie LongsBREEBACK, Ellijah Leffel L on: 12/15/2017 02:39 PM   Modules accepted: Orders

## 2017-12-15 NOTE — Telephone Encounter (Signed)
Ok for solumedrol 125mg  IM.

## 2017-12-18 ENCOUNTER — Telehealth: Payer: Self-pay

## 2017-12-18 NOTE — Telephone Encounter (Signed)
Left message for a return call

## 2017-12-18 NOTE — Telephone Encounter (Signed)
Called and scheduled for nurse visit Solu Medrol 125 mg IM. See MyChart message.

## 2017-12-19 ENCOUNTER — Ambulatory Visit (INDEPENDENT_AMBULATORY_CARE_PROVIDER_SITE_OTHER): Payer: Self-pay | Admitting: Physician Assistant

## 2017-12-19 VITALS — BP 103/60 | HR 96 | Temp 98.4°F | Wt 145.0 lb

## 2017-12-19 DIAGNOSIS — J302 Other seasonal allergic rhinitis: Secondary | ICD-10-CM

## 2017-12-19 MED ORDER — METHYLPREDNISOLONE SODIUM SUCC 125 MG IJ SOLR
125.0000 mg | Freq: Once | INTRAMUSCULAR | Status: AC
  Administered 2017-12-19: 125 mg via INTRAMUSCULAR

## 2017-12-19 NOTE — Progress Notes (Signed)
Pt presents to office for SoluMedrol injection. [place] sol[place]rol 125mg  IM.   Pt tolerated injection well in LUOQ. No immediate complications.   Agree with above plan. Tandy GawJade Breeback PA-C

## 2017-12-25 ENCOUNTER — Other Ambulatory Visit: Payer: Self-pay | Admitting: Physician Assistant

## 2017-12-25 DIAGNOSIS — F411 Generalized anxiety disorder: Secondary | ICD-10-CM

## 2017-12-25 DIAGNOSIS — F429 Obsessive-compulsive disorder, unspecified: Secondary | ICD-10-CM

## 2017-12-25 MED ORDER — SERTRALINE HCL 100 MG PO TABS: 100.0000 mg | ORAL_TABLET | Freq: Every day | ORAL | 0 refills | Status: DC

## 2017-12-26 ENCOUNTER — Other Ambulatory Visit: Payer: Self-pay | Admitting: Physician Assistant

## 2018-03-04 ENCOUNTER — Encounter: Payer: Self-pay | Admitting: Physician Assistant

## 2018-03-05 MED ORDER — BUPROPION HCL ER (XL) 150 MG PO TB24: ORAL_TABLET | ORAL | 0 refills | Status: DC

## 2018-04-12 ENCOUNTER — Encounter: Payer: Self-pay | Admitting: Physician Assistant

## 2018-04-17 ENCOUNTER — Ambulatory Visit (INDEPENDENT_AMBULATORY_CARE_PROVIDER_SITE_OTHER): Payer: Self-pay | Admitting: Physician Assistant

## 2018-04-17 ENCOUNTER — Encounter: Payer: Self-pay | Admitting: Physician Assistant

## 2018-04-17 VITALS — BP 102/73 | HR 76 | Ht 65.0 in | Wt 147.0 lb

## 2018-04-17 DIAGNOSIS — F411 Generalized anxiety disorder: Secondary | ICD-10-CM

## 2018-04-17 DIAGNOSIS — F429 Obsessive-compulsive disorder, unspecified: Secondary | ICD-10-CM

## 2018-04-17 DIAGNOSIS — F325 Major depressive disorder, single episode, in full remission: Secondary | ICD-10-CM

## 2018-04-17 MED ORDER — ESCITALOPRAM OXALATE 10 MG PO TABS: 10.0000 mg | ORAL_TABLET | Freq: Every day | ORAL | 2 refills | Status: DC

## 2018-04-17 NOTE — Progress Notes (Signed)
Subjective:    Patient ID: [name], female    DOB: 01/10/1976, 43 y.o.   MRN: [id number]  HPI: This is a 43 year old female who presents today to discuss her anxiety and depression medications. She in the past was using Zoloft, which she states manages her anxiety symptoms well but her husband feels it levels her out too much. She also complains of a decreased sex drive while using this medication. She was then given viibryd which she cannot remember why she did not like it. She then switched to Wellbutrin which she states worsened her anxiety symptoms instead of improving them. She is very anxious today. She is constantly worrying about things she cannot control. No SI/HC. She denies any feelings of hopelessness or helplessness. She is not having panic attacks. She is not having any overt OCD behaviors right now. She is exercising regularly which is helping her symptoms a lot.   .. Active Ambulatory Problems    Diagnosis Date Noted  . Chest pain 10/16/2013  . Hyperlipidemia 10/16/2013  . GAD (generalized anxiety disorder) 02/03/2015  . OCD (obsessive compulsive disorder) 02/03/2015  . History of HPV infection 03/06/2015  . Cervical dysplasia 03/06/2015  . Insomnia 03/06/2015  . Menorrhagia 05/18/2015  . Depression 12/11/2017   Resolved Ambulatory Problems    Diagnosis Date Noted  . No Resolved Ambulatory Problems   Past Medical History:  Diagnosis Date  . Abnormal Pap smear   . Frequent UTI   . Hx of varicella   . Kidney stones        Review of Systems  All other systems reviewed and are negative.      Objective:   Physical Exam Vitals signs reviewed.  Constitutional:      Appearance: Normal appearance.  HENT:     Head: Normocephalic and atraumatic.  Cardiovascular:     Rate and Rhythm: Normal rate and regular rhythm.     Pulses: Normal pulses.  Neurological:     General: No focal deficit present.     Mental Status: She is alert and oriented to person, place,  and time.  Psychiatric:        Mood and Affect: Mood normal.        Behavior: Behavior normal.           Assessment & Plan:  Marland Kitchen.Marland Kitchen.Ziya was seen today for follow-up.  Diagnoses and all orders for this visit:  GAD (generalized anxiety disorder) -     escitalopram (LEXAPRO) 10 MG tablet; Take 1 tablet (10 mg total) by mouth daily.  Major depressive disorder in full remission, unspecified whether recurrent (HCC) -     escitalopram (LEXAPRO) 10 MG tablet; Take 1 tablet (10 mg total) by mouth daily.  Obsessive-compulsive disorder, unspecified type -     escitalopram (LEXAPRO) 10 MG tablet; Take 1 tablet (10 mg total) by mouth daily.  .. [facility]9 04/17/2018 12/11/2017  Decreased Interest 1 0  Down, Depressed, Hopeless 1 0  PHQ - 2 Score 2 0  Altered sleeping 1 0  Tired, decreased energy 1 0  Change in appetite 0 0  Feeling bad or failure about yourself  0 0  Trouble concentrating 0 0  Moving slowly or fidgety/restless 0 0  Suicidal thoughts 0 0  PHQ-9 Score 4 0  Difficult doing work/chores Not difficult at all Not difficult at all   .Marland Kitchen. GAD 7 : Generalized Anxiety Score 04/17/2018 12/11/2017  Nervous, Anxious, on Edge 3 0  Control/stop worrying 3 0  Worry too much - different things 3 1  Trouble relaxing 3 0  Restless 1 0  Easily annoyed or irritable 2 -  Afraid - awful might happen 3 0  Total GAD 7 Score 18 -  Anxiety Difficulty Somewhat difficult Not difficult at all    Appears like having more anxiety than depression for sure. She responded in the past well to SSRI. Will try lexapro. Start with 1/2 tablet daily after 2-3 weeks after that can increase to 1 tablet. Discussed common side effects and cannot rule out she will not have sexual side effects with this medication as well. Continue exercising. Consider adding back in 5mg  of buspar bid to help with anxiety as well. Follow up in 2-3 months. Let me know how doing in 1 month.   ..I, Tandy Gaw PA-C,  have reviewed and agree with the above documentation in it's entirety.

## 2018-04-17 NOTE — Patient Instructions (Signed)

## 2018-05-22 ENCOUNTER — Other Ambulatory Visit: Payer: Self-pay | Admitting: Obstetrics and Gynecology

## 2018-05-22 DIAGNOSIS — Z1231 Encounter for screening mammogram for malignant neoplasm of breast: Secondary | ICD-10-CM

## 2018-06-05 ENCOUNTER — Encounter: Payer: Self-pay | Admitting: Physician Assistant

## 2018-06-05 ENCOUNTER — Ambulatory Visit (INDEPENDENT_AMBULATORY_CARE_PROVIDER_SITE_OTHER): Payer: Self-pay | Admitting: Physician Assistant

## 2018-06-05 VITALS — BP 113/76 | HR 70 | Temp 98.2°F | Wt 148.0 lb

## 2018-06-05 DIAGNOSIS — J069 Acute upper respiratory infection, unspecified: Secondary | ICD-10-CM

## 2018-06-05 MED ORDER — IPRATROPIUM BROMIDE 0.06 % NA SOLN: 2.0000 | Freq: Four times a day (QID) | NASAL | 0 refills | Status: DC | PRN

## 2018-06-05 NOTE — Progress Notes (Signed)
HPI:                                                                [name] is a 43 y.o. female who presents to [name]: [facility] today for URI symptoms  URI   This is a new problem. The current episode started yesterday. The problem has been gradually worsening. There has been no fever. Associated symptoms include congestion, coughing (nonproductive), ear pain (fullness and popping), sinus pain (pressure) and a sore throat. Pertinent negatives include no nausea or vomiting. Associated symptoms comments: - myalgias. She has tried decongestant (Dayquil) for the symptoms. The treatment provided no relief.  No recent travel No sick contacts   Past Medical History:  Diagnosis Date  . Abnormal Pap smear   . Depression    pp  . Frequent UTI   . Hx of varicella   . Hyperlipidemia   . Kidney stones    Past Surgical History:  Procedure Laterality Date  . DILATION AND CURETTAGE OF UTERUS    . HYSTERECTOMY ABDOMINAL WITH SALPINGECTOMY    . KIDNEY STONE SURGERY    . LEEP     Social History   Tobacco Use  . Smoking status: Former Games developer  . Smokeless tobacco: Never Used  Substance Use Topics  . Alcohol use: Yes    Comment: Occasional   family history includes Alcohol abuse in her maternal grandfather; Anemia in her brother and mother; CAD in her father; Cancer in her mother and paternal aunt; Diabetes in her father; Hyperlipidemia in her mother; Hypertension in her maternal grandmother.    ROS: negative except as noted in the HPI  Medications: Current Outpatient Medications  Medication Sig Dispense Refill  . ipratropium (ATROVENT) 0.06 % nasal spray Place 2 sprays into both nostrils 4 (four) times daily as needed for rhinitis. 15 mL 0   No current facility-administered medications for this visit.    Allergies  Allergen Reactions  . Trazodone And Nefazodone     Bad nightmares and feeling groggy.       Objective:  BP 113/76    Pulse 70   Temp 98.2 F (36.8 C) (Oral)   Wt 148 lb (67.1 kg)   SpO2 99%   BMI 24.63 kg/m  Gen:  alert, not ill-appearing, no distress, appropriate for age HEENT: head normocephalic without obvious abnormality, conjunctiva and cornea clear, tympanic membranes pearly gray and semitransparent bilaterally, external ear canals normal bilaterally, oropharynx clear, tonsils grade 1+, uvula midline, no frontal or maxillary sinus tenderness, nasal mucosa edematous, neck supple, no cervical adenopathy, trachea midline Pulm: Normal work of breathing, normal phonation, clear to auscultation bilaterally, no wheezes, rales or rhonchi CV: Normal rate, regular rhythm, s1 and s2 distinct, no murmurs, clicks or rubs  Neuro: alert and oriented x 3, no tremor MSK: extremities atraumatic, normal gait and station Skin: intact, no rashes on exposed skin, no jaundice, no cyanosis   No results found for this or any previous visit (from the past 72 hour(s)). No results found.    Assessment and Plan: 43 y.o. female with   .[name] was seen today for sinusitis.  Diagnoses and all orders for this visit:  Acute upper respiratory infection -     ipratropium (ATROVENT)  0.06 % nasal spray; Place 2 sprays into both nostrils 4 (four) times daily as needed for rhinitis.   Afebrile, no tachypnea, no tachycardia, pulse ox 99% on room air at rest, no adventitious lung sounds Only 2 days of symptoms Counseled on supportive care for viral upper respiratory infection Counseled on signs and symptoms of bacterial sinusitis   Patient education and anticipatory guidance given Patient agrees with treatment plan Follow-up as needed if symptoms worsen or fail to improve  Levonne Hubert PA-C

## 2018-06-05 NOTE — Patient Instructions (Addendum)
For nasal symptoms/sinusitis: - nasal saline rinses / netti pot several times per day (do this prior to nasal spray) - prescription Atrovent nasal spray: 2 sprays each nostril, up to 4 times per day as needed - you can use OTC nasal decongestant sprays like Afrin (oxymetazoline) BUT do not use for more than 3 days as it will cause worsening congestion/nasal symptoms) - warm facial compresses - oral decongestants and antihistamines like Claritin-D and Zyrtec-D may help dry up secretions (caution using decongestants if you have high blood pressure, heart disease or kidney disease) - for sinus headache: Tylenol 1000mg every 8 hours as needed. Alternate with Ibuprofen 600mg every 6 hours  For sore throat: - Tylenol 1000mg every 8 hours as needed for throat pain. Alternate with Ibuprofen 600mg every 6 hours - Cepacol throat lozenges and/or Chloraseptic spray - Warm salt water gargles  For cough: - Cough is a protective mechanism and an important part of fighting an infection. I encourage you to avoid suppressing your cough with medication during the day if possible.  - Mucinex with at least 8 oz. of water can loosen chest congestion and make cough more productive, which means you will actually cough less - Okay to use a cough suppressant at bedtime in order to rest (Nyquil, Delsym, Robitussin, etc.)  Note: follow package instructions for all over-the-counter medications. If using multi-symptom medications (Dayquil, Theraflu, etc.), check the label for duplicate drug ingredients.    Upper Respiratory Infection, Adult An upper respiratory infection (URI) is a common viral infection of the nose, throat, and upper air passages that lead to the lungs. The most common type of URI is the common cold. URIs usually get better on their own, without medical treatment. What are the causes? A URI is caused by a virus. You may catch a virus by:  Breathing in droplets from an infected person's cough or  sneeze.  Touching something that has been exposed to the virus (contaminated) and then touching your mouth, nose, or eyes. What increases the risk? You are more likely to get a URI if:  You are very young or very old.  It is autumn or winter.  You have close contact with others, such as at a daycare, school, or health care facility.  You smoke.  You have long-term (chronic) heart or lung disease.  You have a weakened disease-fighting (immune) system.  You have nasal allergies or asthma.  You are experiencing a lot of stress.  You work in an area that has poor air circulation.  You have poor nutrition. What are the signs or symptoms? A URI usually involves some of the following symptoms:  Runny or stuffy (congested) nose.  Sneezing.  Cough.  Sore throat.  Headache.  Fatigue.  Fever.  Loss of appetite.  Pain in your forehead, behind your eyes, and over your cheekbones (sinus pain).  Muscle aches.  Redness or irritation of the eyes.  Pressure in the ears or face. How is this diagnosed? This condition may be diagnosed based on your medical history and symptoms, and a physical exam. Your health care provider may use a cotton swab to take a mucus sample from your nose (nasal swab). This sample can be tested to determine what virus is causing the illness. How is this treated? URIs usually get better on their own within 7-10 days. You can take steps at home to relieve your symptoms. Medicines cannot cure URIs, but your health care provider may recommend certain medicines to help relieve   symptoms, such as:  Over-the-counter cold medicines.  Cough suppressants. Coughing is a type of defense against infection that helps to clear the respiratory system, so take these medicines only as recommended by your health care provider.  Fever-reducing medicines. Follow these instructions at home: Activity  Rest as needed.  If you have a fever, stay home from work or school  until your fever is gone or until your health care provider says you are no longer contagious. Your health care provider may have you wear a face mask to prevent your infection from spreading. Relieving symptoms  Gargle with a salt-water mixture 3-4 times a day or as needed. To make a salt-water mixture, completely dissolve -1 tsp of salt in 1 cup of warm water.  Use a cool-mist humidifier to add moisture to the air. This can help you breathe more easily. Eating and drinking   Drink enough fluid to keep your urine pale yellow.  Eat soups and other clear broths. General instructions   Take over-the-counter and prescription medicines only as told by your health care provider. These include cold medicines, fever reducers, and cough suppressants.  Do not use any products that contain nicotine or tobacco, such as cigarettes and e-cigarettes. If you need help quitting, ask your health care provider.  Stay away from secondhand smoke.  Stay up to date on all immunizations, including the yearly (annual) flu vaccine.  Keep all follow-up visits as told by your health care provider. This is important. How to prevent the spread of infection to others   URIs can be passed from person to person (are contagious). To prevent the infection from spreading: ? Wash your hands often with soap and water. If soap and water are not available, use hand sanitizer. ? Avoid touching your mouth, face, eyes, or nose. ? Cough or sneeze into a tissue or your sleeve or elbow instead of into your hand or into the air. Contact a health care provider if:  You are getting worse instead of better.  You have a fever or chills.  Your mucus is brown or red.  You have yellow or brown discharge coming from your nose.  You have pain in your face, especially when you bend forward.  You have swollen neck glands.  You have pain while swallowing.  You have white areas in the back of your throat. Get help right away  if:  You have shortness of breath that gets worse.  You have severe or persistent: ? Headache. ? Ear pain. ? Sinus pain. ? Chest pain.  You have chronic lung disease along with any of the following: ? Wheezing. ? Prolonged cough. ? Coughing up blood. ? A change in your usual mucus.  You have a stiff neck.  You have changes in your: ? Vision. ? Hearing. ? Thinking. ? Mood. Summary  An upper respiratory infection (URI) is a common infection of the nose, throat, and upper air passages that lead to the lungs.  A URI is caused by a virus.  URIs usually get better on their own within 7-10 days.  Medicines cannot cure URIs, but your health care provider may recommend certain medicines to help relieve symptoms. This information is not intended to replace advice given to you by your health care provider. Make sure you discuss any questions you have with your health care provider. Document Released: 09/14/2000 Document Revised: 11/04/2016 Document Reviewed: 11/04/2016 Elsevier Interactive Patient Education  2019 [employer] Inc[employer]  

## 2018-08-28 ENCOUNTER — Encounter: Payer: Self-pay | Admitting: Physician Assistant

## 2018-08-28 ENCOUNTER — Ambulatory Visit (INDEPENDENT_AMBULATORY_CARE_PROVIDER_SITE_OTHER): Payer: Self-pay | Admitting: Physician Assistant

## 2018-08-28 VITALS — HR 70 | Temp 98.8°F | Ht 65.0 in | Wt 140.0 lb

## 2018-08-28 DIAGNOSIS — F411 Generalized anxiety disorder: Secondary | ICD-10-CM

## 2018-08-28 DIAGNOSIS — F325 Major depressive disorder, single episode, in full remission: Secondary | ICD-10-CM

## 2018-08-28 DIAGNOSIS — F429 Obsessive-compulsive disorder, unspecified: Secondary | ICD-10-CM

## 2018-08-28 DIAGNOSIS — G479 Sleep disorder, unspecified: Secondary | ICD-10-CM

## 2018-08-28 MED ORDER — SERTRALINE HCL 50 MG PO TABS: 50.0000 mg | ORAL_TABLET | Freq: Every day | ORAL | 2 refills | Status: DC

## 2018-08-28 NOTE — Progress Notes (Deleted)
Patient states the last couple months her anxiety has gotten worse. PHQ9(2)-GAD7 completed. 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 Wants to discuss restarting zoloft.

## 2018-08-28 NOTE — Progress Notes (Signed)
Patient ID: [name], female   DOB: 27-Jun-1975, 43 y.o.   MRN: [id number] .Marland KitchenVirtual Visit via Video Note  I connected with Nivia Patricelli on 08/28/18 at  1:20 PM EDT by a video enabled telemedicine application and verified that I am speaking with the correct person using two identifiers.  Location: Patient: home Provider: home   I discussed the limitations of evaluation and management by telemedicine and the availability of in person appointments. The patient expressed understanding and agreed to proceed.  History of Present Illness: Pt is a 43 yo female with GAD, MDD, OCD who calls into the clinic to discuss going back on zoloft for mood. She had been doing ok off all medication for mood. Since COVID and working from home and being with her kids all day her mood has worsened a lot. She is very anxious and agitated. She never started lexapro because she was scared it was going to make more depressed like wellbutrin did. zoloft has always worked the best but causes sexual side effects. No SI/HC. She is exercising. She is not sleeping well. She is having trouble going to sleep and waking up really easily.   .. Active Ambulatory Problems    Diagnosis Date Noted  . Chest pain 10/16/2013  . Hyperlipidemia 10/16/2013  . GAD (generalized anxiety disorder) 02/03/2015  . OCD (obsessive compulsive disorder) 02/03/2015  . History of HPV infection 03/06/2015  . Cervical dysplasia 03/06/2015  . Trouble in sleeping 03/06/2015  . Menorrhagia 05/18/2015  . Depression 12/11/2017   Resolved Ambulatory Problems    Diagnosis Date Noted  . No Resolved Ambulatory Problems   Past Medical History:  Diagnosis Date  . Abnormal Pap smear   . Frequent UTI   . Hx of varicella   . Kidney stones        Observations/Objective: No acute distress. Normal appearance.  Normal breathing.   .. Today's Vitals   08/28/18 1305  Pulse: 70  Temp: 98.8 F (37.1 C)  TempSrc: Oral  Weight: 140 lb (63.5 kg)   Height: 5\' 5"  (1.651 m)   Body mass index is 23.3 kg/m.   .. Depression screen [facility] 2/9 08/28/2018 04/17/2018 12/11/2017  Decreased Interest 0 1 0  Down, Depressed, Hopeless 0 1 0  PHQ - 2 Score 0 2 0  Altered sleeping - 1 0  Tired, decreased energy - 1 0  Change in appetite - 0 0  Feeling bad or failure about yourself  - 0 0  Trouble concentrating - 0 0  Moving slowly or fidgety/restless - 0 0  Suicidal thoughts - 0 0  PHQ-9 Score - 4 0  Difficult doing work/chores - Not difficult at all Not difficult at all   .Marland Kitchen GAD 7 : Generalized Anxiety Score 08/28/2018 04/17/2018 12/11/2017  Nervous, Anxious, on Edge 1 3 0  Control/stop worrying 1 3 0  Worry too much - different things 1 3 1   Trouble relaxing 1 3 0  Restless 1 1 0  Easily annoyed or irritable 1 2 -  Afraid - awful might happen 0 3 0  Total GAD 7 Score 6 18 -  Anxiety Difficulty Somewhat difficult Somewhat difficult Not difficult at all     Assessment and Plan: Marland KitchenMarland KitchenApril was seen today for anxiety.  Diagnoses and all orders for this visit:  GAD (generalized anxiety disorder) -     sertraline (ZOLOFT) 50 MG tablet; Take 1 tablet (50 mg total) by mouth daily.  Trouble in sleeping  Major depressive  disorder in full remission, unspecified whether recurrent (HCC) -     sertraline (ZOLOFT) 50 MG tablet; Take 1 tablet (50 mg total) by mouth daily.  Obsessive-compulsive disorder, unspecified type -     sertraline (ZOLOFT) 50 MG tablet; Take 1 tablet (50 mg total) by mouth daily.   Despite her sexual side effects she would like to try lower dose zoloft. She was having the sexual side effects at 100mg . Will start at 25mg  and increase to 50mg  as needed. Discussed good sleep hygiene. Start melatonin about 1 hour before bedtime. Follow up in 3 months or sooner if needed.   Follow Up Instructions:    I discussed the assessment and treatment plan with the patient. The patient was provided an opportunity to ask questions and all  were answered. The patient agreed with the plan and demonstrated an understanding of the instructions.   The patient was advised to call back or seek an in-person evaluation if the symptoms worsen or if the condition fails to improve as anticipated.     Tandy GawJade Breeback, PA-C

## 2018-11-28 ENCOUNTER — Other Ambulatory Visit: Payer: Self-pay | Admitting: Physician Assistant

## 2018-11-28 DIAGNOSIS — F411 Generalized anxiety disorder: Secondary | ICD-10-CM

## 2018-11-28 DIAGNOSIS — F429 Obsessive-compulsive disorder, unspecified: Secondary | ICD-10-CM

## 2018-11-28 DIAGNOSIS — F325 Major depressive disorder, single episode, in full remission: Secondary | ICD-10-CM

## 2018-12-06 ENCOUNTER — Encounter: Payer: Self-pay | Admitting: Physician Assistant

## 2019-01-07 ENCOUNTER — Other Ambulatory Visit: Payer: Self-pay | Admitting: Physician Assistant

## 2019-01-07 DIAGNOSIS — F325 Major depressive disorder, single episode, in full remission: Secondary | ICD-10-CM

## 2019-01-07 DIAGNOSIS — F429 Obsessive-compulsive disorder, unspecified: Secondary | ICD-10-CM

## 2019-01-07 DIAGNOSIS — F411 Generalized anxiety disorder: Secondary | ICD-10-CM

## 2019-02-03 ENCOUNTER — Other Ambulatory Visit: Payer: Self-pay | Admitting: Physician Assistant

## 2019-02-03 DIAGNOSIS — F325 Major depressive disorder, single episode, in full remission: Secondary | ICD-10-CM

## 2019-02-03 DIAGNOSIS — F429 Obsessive-compulsive disorder, unspecified: Secondary | ICD-10-CM

## 2019-02-03 DIAGNOSIS — F411 Generalized anxiety disorder: Secondary | ICD-10-CM

## 2019-02-13 ENCOUNTER — Encounter: Payer: Self-pay | Admitting: Physician Assistant

## 2019-02-13 ENCOUNTER — Other Ambulatory Visit: Payer: Self-pay | Admitting: Physician Assistant

## 2019-02-13 DIAGNOSIS — F325 Major depressive disorder, single episode, in full remission: Secondary | ICD-10-CM

## 2019-02-13 DIAGNOSIS — F411 Generalized anxiety disorder: Secondary | ICD-10-CM

## 2019-02-13 DIAGNOSIS — F429 Obsessive-compulsive disorder, unspecified: Secondary | ICD-10-CM

## 2019-02-19 ENCOUNTER — Encounter: Payer: Self-pay | Admitting: Physician Assistant

## 2019-02-19 ENCOUNTER — Ambulatory Visit (INDEPENDENT_AMBULATORY_CARE_PROVIDER_SITE_OTHER): Payer: Self-pay | Admitting: Physician Assistant

## 2019-02-19 ENCOUNTER — Other Ambulatory Visit: Payer: Self-pay

## 2019-02-19 VITALS — BP 108/65 | HR 70 | Ht 65.0 in | Wt 145.0 lb

## 2019-02-19 DIAGNOSIS — F411 Generalized anxiety disorder: Secondary | ICD-10-CM

## 2019-02-19 DIAGNOSIS — F325 Major depressive disorder, single episode, in full remission: Secondary | ICD-10-CM

## 2019-02-19 DIAGNOSIS — F429 Obsessive-compulsive disorder, unspecified: Secondary | ICD-10-CM

## 2019-02-19 DIAGNOSIS — G479 Sleep disorder, unspecified: Secondary | ICD-10-CM

## 2019-02-19 MED ORDER — HYDROXYZINE HCL 50 MG PO TABS: 50.0000 mg | ORAL_TABLET | Freq: Every evening | ORAL | 5 refills | Status: DC | PRN

## 2019-02-19 MED ORDER — SERTRALINE HCL 50 MG PO TABS: 50.0000 mg | ORAL_TABLET | Freq: Every day | ORAL | 3 refills | Status: DC

## 2019-02-19 NOTE — Progress Notes (Signed)
Subjective:    Patient ID: [name], female    DOB: 1975-04-07, 43 y.o.   MRN: [id number]  HPI Pt is a 43 yo MDD, GAD, OCD who presents to the clinic for medication follow up.   Pt had a recent hx of kidney stone in October in her right ureter about 27mm. It did pass. She has no symptoms now.   She did have ablation done for DUB.   She is doing well with her mood. Anxiety and depression is controlled. She continues to have anxiety that she wishes could be a little better. She admits to not sleeping very well. She has trouble going to sleep. She has taken up to 15mg  of melatonin without benefit. If she goes above current dose she does have some sexual side effects.   .. Active Ambulatory Problems    Diagnosis Date Noted  . Chest pain 10/16/2013  . Hyperlipidemia 10/16/2013  . GAD (generalized anxiety disorder) 02/03/2015  . OCD (obsessive compulsive disorder) 02/03/2015  . History of HPV infection 03/06/2015  . Cervical dysplasia 03/06/2015  . Trouble in sleeping 03/06/2015  . Menorrhagia 05/18/2015  . Depression 12/11/2017   Resolved Ambulatory Problems    Diagnosis Date Noted  . No Resolved Ambulatory Problems   Past Medical History:  Diagnosis Date  . Abnormal Pap smear   . Frequent UTI   . Hx of varicella   . Kidney stones      Review of Systems  All other systems reviewed and are negative.      Objective:   Physical Exam Vitals signs reviewed.  Constitutional:      Appearance: Normal appearance.  Cardiovascular:     Rate and Rhythm: Normal rate and regular rhythm.     Pulses: Normal pulses.  Pulmonary:     Effort: Pulmonary effort is normal.     Breath sounds: Normal breath sounds.  Neurological:     General: No focal deficit present.     Mental Status: She is alert and oriented to person, place, and time.  Psychiatric:        Mood and Affect: Mood normal.        Behavior: Behavior normal.     .. Depression screen [facility] 2/9 02/19/2019 08/28/2018  04/17/2018 12/11/2017  Decreased Interest 0 0 1 0  Down, Depressed, Hopeless 0 0 1 0  PHQ - 2 Score 0 0 2 0  Altered sleeping 1 - 1 0  Tired, decreased energy 1 - 1 0  Change in appetite 0 - 0 0  Feeling bad or failure about yourself  0 - 0 0  Trouble concentrating 0 - 0 0  Moving slowly or fidgety/restless 0 - 0 0  Suicidal thoughts 0 - 0 0  PHQ-9 Score 2 - 4 0  Difficult doing work/chores Not difficult at all - Not difficult at all Not difficult at all   .Marland Kitchen GAD 7 : Generalized Anxiety Score 02/19/2019 08/28/2018 04/17/2018 12/11/2017  Nervous, Anxious, on Edge 2 1 3  0  Control/stop worrying 2 1 3  0  Worry too much - different things 2 1 3 1   Trouble relaxing 1 1 3  0  Restless 1 1 1  0  Easily annoyed or irritable 2 1 2  -  Afraid - awful might happen 2 0 3 0  Total GAD 7 Score 12 6 18  -  Anxiety Difficulty Somewhat difficult Somewhat difficult Somewhat difficult Not difficult at all          Assessment &  Plan:  Marland KitchenMarland KitchenApril was seen today for anxiety.  Diagnoses and all orders for this visit:  Trouble in sleeping -     hydrOXYzine (ATARAX/VISTARIL) 50 MG tablet; Take 1 tablet (50 mg total) by mouth at bedtime and may repeat dose one time if needed. For itching.  GAD (generalized anxiety disorder) -     sertraline (ZOLOFT) 50 MG tablet; Take 1 tablet (50 mg total) by mouth daily.  Major depressive disorder in full remission, unspecified whether recurrent (HCC) -     sertraline (ZOLOFT) 50 MG tablet; Take 1 tablet (50 mg total) by mouth daily.  Obsessive-compulsive disorder, unspecified type -     sertraline (ZOLOFT) 50 MG tablet; Take 1 tablet (50 mg total) by mouth daily.   Refilled zoloft. Vistaril given to try for sleep. Discussed trying unisom as well. Encouraged exercise.  Lab work done at urology.  Needs lipid. She is self pay and declines today.

## 2019-02-25 ENCOUNTER — Other Ambulatory Visit: Payer: Self-pay | Admitting: Physician Assistant

## 2019-02-25 DIAGNOSIS — G479 Sleep disorder, unspecified: Secondary | ICD-10-CM

## 2019-05-10 ENCOUNTER — Other Ambulatory Visit: Payer: Self-pay | Admitting: Physician Assistant

## 2019-05-10 DIAGNOSIS — F325 Major depressive disorder, single episode, in full remission: Secondary | ICD-10-CM

## 2019-05-10 DIAGNOSIS — F411 Generalized anxiety disorder: Secondary | ICD-10-CM

## 2019-05-10 DIAGNOSIS — F429 Obsessive-compulsive disorder, unspecified: Secondary | ICD-10-CM

## 2019-08-01 ENCOUNTER — Telehealth (INDEPENDENT_AMBULATORY_CARE_PROVIDER_SITE_OTHER): Payer: Self-pay | Admitting: Family Medicine

## 2019-08-01 ENCOUNTER — Encounter: Payer: Self-pay | Admitting: Physician Assistant

## 2019-08-01 ENCOUNTER — Encounter: Payer: Self-pay | Admitting: Family Medicine

## 2019-08-01 DIAGNOSIS — J309 Allergic rhinitis, unspecified: Secondary | ICD-10-CM | POA: Insufficient documentation

## 2019-08-01 DIAGNOSIS — J0100 Acute maxillary sinusitis, unspecified: Secondary | ICD-10-CM

## 2019-08-01 DIAGNOSIS — J329 Chronic sinusitis, unspecified: Secondary | ICD-10-CM | POA: Insufficient documentation

## 2019-08-01 MED ORDER — AMOXICILLIN-POT CLAVULANATE 875-125 MG PO TABS: 1.0000 | ORAL_TABLET | Freq: Two times a day (BID) | ORAL | 0 refills | Status: DC

## 2019-08-01 MED ORDER — PREDNISONE 20 MG PO TABS
20.0000 mg | ORAL_TABLET | Freq: Two times a day (BID) | ORAL | 0 refills | Status: AC
Start: 2019-08-01 — End: 2019-08-06

## 2019-08-01 NOTE — Progress Notes (Signed)
Started taking allergy medication in March to get ahead of the season. Added Flonase.  Top of her teeth have been hurting.  Small amount of blood when blowing her nose.  Sniffling, runny nose, itchy eyes, sneezing.

## 2019-08-01 NOTE — Progress Notes (Signed)
[name] - 44 y.o. female MRN [id number]  Date of birth: 10-19-1975   This visit type was conducted due to national recommendations for restrictions regarding the COVID-19 Pandemic (e.g. social distancing).  This format is felt to be most appropriate for this patient at this time.  All issues noted in this document were discussed and addressed.  No physical exam was performed (except for noted visual exam findings with Video Visits).  I discussed the limitations of evaluation and management by telemedicine and the availability of in person appointments. The patient expressed understanding and agreed to proceed.  I connected with@ on 08/01/19 at  2:20 PM EDT by a video enabled telemedicine application and verified that I am speaking with the correct person using two identifiers.  Interactive audio and video telecommunications were attempted between this provider and patient, however failed, due to patient having technical difficulties OR patient did not have access to video capability.  We continued and completed visit with audio only.    Present at visit: Megan Nutting, DO [name]   Patient Location: Home [place] [address]   Provider location:   [facility]  Chief Complaint  Patient presents with  . Nasal Congestion    HPI  [name] is a 44 y.o. female who presents via Engineer, civil (consulting) for a telehealth visit today.  She has complaint of allergies.  She reports that she has had issues with allergies since late March.  Symptoms include congestion, sneezing, itchy eyes, and scratchy throat.  She has been taking allegra daily however had worsening symptoms late last week and she added flonase.  Symptoms have continued to worsen and today developed some pain in upper teeth and small amount of blood when blowing her nose.  She denies fever, chills, headache, nausea, vomiting, diarrhea.    ROS:  A comprehensive ROS was completed and negative except as noted per  HPI  Past Medical History:  Diagnosis Date  . Abnormal Pap smear   . Depression    pp  . Frequent UTI   . Hx of varicella   . Hyperlipidemia   . Kidney stones     Past Surgical History:  Procedure Laterality Date  . DILATION AND CURETTAGE OF UTERUS    . HYSTERECTOMY ABDOMINAL WITH SALPINGECTOMY    . KIDNEY STONE SURGERY    . LEEP      Family History  Problem Relation Age of Onset  . Diabetes Father   . CAD Father        Died of MI at age 77  . Cancer Paternal Aunt        pancreatic  . Alcohol abuse Maternal Grandfather   . Anemia Mother   . Cancer Mother        bone  . Hyperlipidemia Mother   . Anemia Brother   . Hypertension Maternal Grandmother   . Breast cancer Neg Hx     Social History   Socioeconomic History  . Marital status: Married    Spouse name: Not on file  . Number of children: 3  . Years of education: Not on file  . Highest education level: Not on file  Occupational History    Employer: OTHER  Tobacco Use  . Smoking status: Former Research scientist (life sciences)  . Smokeless tobacco: Never Used  Substance and Sexual Activity  . Alcohol use: Yes    Comment: Occasional  . Drug use: No  . Sexual activity: Yes    Partners: Male  Birth control/protection: Other-see comments    Comment: Hysterectomy  Other Topics Concern  . Not on file  Social History Narrative  . Not on file   Social Determinants of Health   Financial Resource Strain:   . Difficulty of Paying Living Expenses:   Food Insecurity:   . Worried About Programme researcher, broadcasting/film/video in the Last Year:   . Barista in the Last Year:   Transportation Needs:   . Freight forwarder (Medical):   Marland Kitchen Lack of Transportation (Non-Medical):   Physical Activity:   . Days of Exercise per Week:   . Minutes of Exercise per Session:   Stress:   . Feeling of Stress :   Social Connections:   . Frequency of Communication with Friends and Family:   . Frequency of Social Gatherings with Friends and Family:   .  Attends Religious Services:   . Active Member of Clubs or Organizations:   . Attends Banker Meetings:   Marland Kitchen Marital Status:   Intimate Partner Violence:   . Fear of Current or Ex-Partner:   . Emotionally Abused:   Marland Kitchen Physically Abused:   . Sexually Abused:      Current Outpatient Medications:  .  sertraline (ZOLOFT) 50 MG tablet, Take 1 tablet (50 mg total) by mouth daily., Disp: 90 tablet, Rfl: 3 .  amoxicillin-clavulanate (AUGMENTIN) 875-125 MG tablet, Take 1 tablet by mouth 2 (two) times daily., Disp: 20 tablet, Rfl: 0 .  predniSONE (DELTASONE) 20 MG tablet, Take 1 tablet (20 mg total) by mouth 2 (two) times daily with a meal for 5 days., Disp: 10 tablet, Rfl: 0  EXAM:  VITALS per patient if applicable: Wt 143 lb (64.9 kg)   BMI 23.80 kg/m   GENERAL: alert, oriented, in no acute distress  PSYCH/NEURO: pleasant and cooperative, no obvious depression or anxiety, speech and thought processing grossly intact  ASSESSMENT AND PLAN:  Discussed the following assessment and plan:  Sinusitis Seems to be allergy related however may be in early stages of bacterial sinusitis.  Will have her increase allegra to twice daily.  Continue flonase.  Add prednisone 20mg  BID x5 days.  Will go ahead and call in course of augmentin as she is going out of town next week.  I instructed her to start this if she continues to have worsening symptoms.    30 minutes spent including pre visit preparation, review of prior notes and labs, encounter with patient via video visit and same day documentation.     I discussed the assessment and treatment plan with the patient. The patient was provided an opportunity to ask questions and all were answered. The patient agreed with the plan and demonstrated an understanding of the instructions.   The patient was advised to call back or seek an in-person evaluation if the symptoms worsen or if the condition fails to improve as anticipated.    , DO

## 2019-08-01 NOTE — Assessment & Plan Note (Signed)
Seems to be allergy related however may be in early stages of bacterial sinusitis.  Will have her increase allegra to twice daily.  Continue flonase.  Add prednisone 20mg  BID x5 days.  Will go ahead and call in course of augmentin as she is going out of town next week.  I instructed her to start this if she continues to have worsening symptoms.

## 2019-11-26 ENCOUNTER — Encounter: Payer: Self-pay | Admitting: Physician Assistant

## 2019-11-27 ENCOUNTER — Telehealth (INDEPENDENT_AMBULATORY_CARE_PROVIDER_SITE_OTHER): Payer: Self-pay | Admitting: Physician Assistant

## 2019-11-27 ENCOUNTER — Encounter: Payer: Self-pay | Admitting: Physician Assistant

## 2019-11-27 ENCOUNTER — Other Ambulatory Visit: Payer: Self-pay

## 2019-11-27 VITALS — BP 118/76 | Ht 65.0 in | Wt 143.0 lb

## 2019-11-27 DIAGNOSIS — F325 Major depressive disorder, single episode, in full remission: Secondary | ICD-10-CM

## 2019-11-27 DIAGNOSIS — F411 Generalized anxiety disorder: Secondary | ICD-10-CM

## 2019-11-27 DIAGNOSIS — F410 Panic disorder [episodic paroxysmal anxiety] without agoraphobia: Secondary | ICD-10-CM | POA: Insufficient documentation

## 2019-11-27 DIAGNOSIS — F429 Obsessive-compulsive disorder, unspecified: Secondary | ICD-10-CM

## 2019-11-27 MED ORDER — BUSPIRONE HCL 7.5 MG PO TABS: 7.5000 mg | ORAL_TABLET | Freq: Three times a day (TID) | ORAL | 2 refills | Status: DC

## 2019-11-27 MED ORDER — ALPRAZOLAM 0.5 MG PO TABS: 0.5000 mg | ORAL_TABLET | Freq: Two times a day (BID) | ORAL | 1 refills | Status: DC | PRN

## 2019-11-27 NOTE — Progress Notes (Signed)
..Virtual Visit via Video Note  I connected with [name] on 11/27/19 at  8:30 AM EDT by a video enabled telemedicine application and verified that I am speaking with the correct person using two identifiers.  Location: Patient: car Provider: clinic   I discussed the limitations of evaluation and management by telemedicine and the availability of in person appointments. The patient expressed understanding and agreed to proceed.  History of Present Illness: Pt is a 44 yo female with GAD, OCD, MDD whose anxiety is out of control. She has honestly felt this way during most of the pandemic.  She is really had a lot of anxiety around Covid and then Covid vaccination.  She finally decided to get vaccinated.  This week she also been found a breast lump.  She completely went into a panic attack.  She did see her GYN and she reassured her that this could be a vaccine reaction.  Patient was very upset by this news.  They are watching the breast lump.  She feels like she may remain in the state getting the vaccine.  Her husband did not want her to get this vaccine.  Her kids went to school this week.  She just feels like she is in a constant state of panic.  She is having a lot of sweating, heart racing, racing thoughts.  She has been on Zoloft for a while but when she increases the dose she has more side effects.  She does not want to do this.  She denies any suicidal thoughts or homicidal idealizations.  .. Active Ambulatory Problems    Diagnosis Date Noted  . Chest pain 10/16/2013  . Hyperlipidemia 10/16/2013  . GAD (generalized anxiety disorder) 02/03/2015  . OCD (obsessive compulsive disorder) 02/03/2015  . History of HPV infection 03/06/2015  . Cervical dysplasia 03/06/2015  . Trouble in sleeping 03/06/2015  . Menorrhagia 05/18/2015  . Depression 12/11/2017  . Sinusitis 08/01/2019  . Panic attack 11/27/2019   Resolved Ambulatory Problems    Diagnosis Date Noted  . No Resolved Ambulatory  Problems   Past Medical History:  Diagnosis Date  . Abnormal Pap smear   . Frequent UTI   . Hx of varicella   . Kidney stones    Reviewed med, allergy, problem list.     Observations/Objective: NO acute distress Anxious in conversation.  Normal breathing.  Normal appearance.   .. Today's Vitals   11/27/19 0810  BP: 118/76  Weight: 143 lb (64.9 kg)  Height: 5\' 5"  (1.651 m)   Body mass index is 23.8 kg/m.   ... Depression screen [facility] 2/9 11/27/2019 02/19/2019 08/28/2018 04/17/2018 12/11/2017  Decreased Interest 0 0 0 1 0  Down, Depressed, Hopeless 0 0 0 1 0  PHQ - 2 Score 0 0 0 2 0  Altered sleeping 3 1 - 1 0  Tired, decreased energy 1 1 - 1 0  Change in appetite 0 0 - 0 0  Feeling bad or failure about yourself  0 0 - 0 0  Trouble concentrating 0 0 - 0 0  Moving slowly or fidgety/restless 0 0 - 0 0  Suicidal thoughts 0 0 - 0 0  PHQ-9 Score 4 2 - 4 0  Difficult doing work/chores Not difficult at all Not difficult at all - Not difficult at all Not difficult at all   .02/10/2018 GAD 7 : Generalized Anxiety Score 11/27/2019 02/19/2019 08/28/2018 04/17/2018  Nervous, Anxious, on Edge 1 2 1 3   Control/stop worrying 3  2 1 3   Worry too much - different things 3 2 1 3   Trouble relaxing 1 1 1 3   Restless 0 1 1 1   Easily annoyed or irritable 1 2 1 2   Afraid - awful might happen 3 2 0 3  Total GAD 7 Score 12 12 6 18   Anxiety Difficulty Not difficult at all Somewhat difficult Somewhat difficult Somewhat difficult     Assessment and Plan:  [name] was seen today for anxiety.  Diagnoses and all orders for this visit:  GAD (generalized anxiety disorder) -     busPIRone (BUSPAR) 7.5 MG tablet; Take 1 tablet (7.5 mg total) by mouth 3 (three) times daily.  Major depressive disorder in full remission, unspecified whether recurrent (HCC)  Obsessive-compulsive disorder, unspecified type  Panic attack -     busPIRone (BUSPAR) 7.5 MG tablet; Take 1 tablet (7.5 mg total) by mouth 3 (three)  times daily. -     ALPRAZolam (XANAX) 0.5 MG tablet; Take 1 tablet (0.5 mg total) by mouth 2 (two) times daily as needed for anxiety.   Pt wants to keep zoloft the same because of side effects that she has at higher dosages. Added buspar TID and xanax as needed. Discussed grounding, meditation, exercise. Reassured her that she did the right thing by getting the vaccine.  Follow up in 2-3 weeks.   Breast lump is being managed by GYN.     Follow Up Instructions:    I discussed the assessment and treatment plan with the patient. The patient was provided an opportunity to ask questions and all were answered. The patient agreed with the plan and demonstrated an understanding of the instructions.   The patient was advised to call back or seek an in-person evaluation if the symptoms worsen or if the condition fails to improve as anticipated.  I provided 10 minutes of non-face-to-face time during this encounter.   , PA-C

## 2019-11-27 NOTE — Progress Notes (Signed)
Patient wants to discuss mood. PHQ9 (4)-GAD7 (12) completed.

## 2019-12-19 ENCOUNTER — Other Ambulatory Visit: Payer: Self-pay | Admitting: Physician Assistant

## 2019-12-19 DIAGNOSIS — F410 Panic disorder [episodic paroxysmal anxiety] without agoraphobia: Secondary | ICD-10-CM

## 2019-12-19 DIAGNOSIS — F411 Generalized anxiety disorder: Secondary | ICD-10-CM

## 2020-03-09 ENCOUNTER — Other Ambulatory Visit: Payer: Self-pay | Admitting: Physician Assistant

## 2020-03-09 DIAGNOSIS — F325 Major depressive disorder, single episode, in full remission: Secondary | ICD-10-CM

## 2020-03-09 DIAGNOSIS — F429 Obsessive-compulsive disorder, unspecified: Secondary | ICD-10-CM

## 2020-03-09 DIAGNOSIS — F411 Generalized anxiety disorder: Secondary | ICD-10-CM

## 2020-04-01 ENCOUNTER — Encounter: Payer: Self-pay | Admitting: Physician Assistant

## 2020-04-02 ENCOUNTER — Telehealth (INDEPENDENT_AMBULATORY_CARE_PROVIDER_SITE_OTHER): Payer: Self-pay | Admitting: Nurse Practitioner

## 2020-04-02 ENCOUNTER — Other Ambulatory Visit: Payer: Self-pay

## 2020-04-02 ENCOUNTER — Encounter: Payer: Self-pay | Admitting: Nurse Practitioner

## 2020-04-02 DIAGNOSIS — J0140 Acute pansinusitis, unspecified: Secondary | ICD-10-CM

## 2020-04-02 MED ORDER — AMOXICILLIN-POT CLAVULANATE 875-125 MG PO TABS: 1.0000 | ORAL_TABLET | Freq: Two times a day (BID) | ORAL | 0 refills | Status: DC

## 2020-04-02 NOTE — Patient Instructions (Signed)
Sinusitis, Adult Sinusitis is inflammation of your sinuses. Sinuses are hollow spaces in the bones around your face. Your sinuses are located:  Around your eyes.  In the middle of your forehead.  Behind your nose.  In your cheekbones. Mucus normally drains out of your sinuses. When your nasal tissues become inflamed or swollen, mucus can become trapped or blocked. This allows bacteria, viruses, and fungi to grow, which leads to infection. Most infections of the sinuses are caused by a virus. Sinusitis can develop quickly. It can last for up to 4 weeks (acute) or for more than 12 weeks (chronic). Sinusitis often develops after a cold. What are the causes? This condition is caused by anything that creates swelling in the sinuses or stops mucus from draining. This includes:  Allergies.  Asthma.  Infection from bacteria or viruses.  Deformities or blockages in your nose or sinuses.  Abnormal growths in the nose (nasal polyps).  Pollutants, such as chemicals or irritants in the air.  Infection from fungi (rare). What increases the risk? You are more likely to develop this condition if you:  Have a weak body defense system (immune system).  Do a lot of swimming or diving.  Overuse nasal sprays.  Smoke. What are the signs or symptoms? The main symptoms of this condition are pain and a feeling of pressure around the affected sinuses. Other symptoms include:  Stuffy nose or congestion.  Thick drainage from your nose.  Swelling and warmth over the affected sinuses.  Headache.  Upper toothache.  A cough that may get worse at night.  Extra mucus that collects in the throat or the back of the nose (postnasal drip).  Decreased sense of smell and taste.  Fatigue.  A fever.  Sore throat.  Bad breath. How is this diagnosed? This condition is diagnosed based on:  Your symptoms.  Your medical history.  A physical exam.  Tests to find out if your condition is  acute or chronic. This may include: ? Checking your nose for nasal polyps. ? Viewing your sinuses using a device that has a light (endoscope). ? Testing for allergies or bacteria. ? Imaging tests, such as an MRI or CT scan. In rare cases, a bone biopsy may be done to rule out more serious types of fungal sinus disease. How is this treated? Treatment for sinusitis depends on the cause and whether your condition is chronic or acute.  If caused by a virus, your symptoms should go away on their own within 10 days. You may be given medicines to relieve symptoms. They include: ? Medicines that shrink swollen nasal passages (topical intranasal decongestants). ? Medicines that treat allergies (antihistamines). ? A spray that eases inflammation of the nostrils (topical intranasal corticosteroids). ? Rinses that help get rid of thick mucus in your nose (nasal saline washes).  If caused by bacteria, your health care provider may recommend waiting to see if your symptoms improve. Most bacterial infections will get better without antibiotic medicine. You may be given antibiotics if you have: ? A severe infection. ? A weak immune system.  If caused by narrow nasal passages or nasal polyps, you may need to have surgery. Follow these instructions at home: Medicines  Take, use, or apply over-the-counter and prescription medicines only as told by your health care provider. These may include nasal sprays.  If you were prescribed an antibiotic medicine, take it as told by your health care provider. Do not stop taking the antibiotic even if you start   to feel better. Hydrate and humidify   Drink enough fluid to keep your urine pale yellow. Staying hydrated will help to thin your mucus.  Use a cool mist humidifier to keep the humidity level in your home above 50%.  Inhale steam for 10-15 minutes, 3-4 times a day, or as told by your health care provider. You can do this in the bathroom while a hot shower is  running.  Limit your exposure to cool or dry air. Rest  Rest as much as possible.  Sleep with your head raised (elevated).  Make sure you get enough sleep each night. General instructions   Apply a warm, moist washcloth to your face 3-4 times a day or as told by your health care provider. This will help with discomfort.  Wash your hands often with soap and water to reduce your exposure to germs. If soap and water are not available, use hand sanitizer.  Do not smoke. Avoid being around people who are smoking (secondhand smoke).  Keep all follow-up visits as told by your health care provider. This is important. Contact a health care provider if:  You have a fever.  Your symptoms get worse.  Your symptoms do not improve within 10 days. Get help right away if:  You have a severe headache.  You have persistent vomiting.  You have severe pain or swelling around your face or eyes.  You have vision problems.  You develop confusion.  Your neck is stiff.  You have trouble breathing. Summary  Sinusitis is soreness and inflammation of your sinuses. Sinuses are hollow spaces in the bones around your face.  This condition is caused by nasal tissues that become inflamed or swollen. The swelling traps or blocks the flow of mucus. This allows bacteria, viruses, and fungi to grow, which leads to infection.  If you were prescribed an antibiotic medicine, take it as told by your health care provider. Do not stop taking the antibiotic even if you start to feel better.  Keep all follow-up visits as told by your health care provider. This is important. This information is not intended to replace advice given to you by your health care provider. Make sure you discuss any questions you have with your health care provider. Document Revised: 08/21/2017 Document Reviewed: 08/21/2017 Elsevier Patient Education  2020 [employer]  

## 2020-04-02 NOTE — Progress Notes (Signed)
Virtual Video Visit via MyChart Note  I connected with  [name] on 04/02/20 at  1:10 PM EST by the video enabled telemedicine application for , MyChart, and verified that I am speaking with the correct person using two identifiers.   I introduced myself as a Publishing rights manager with the practice. We discussed the limitations of evaluation and management by telemedicine and the availability of in person appointments. The patient expressed understanding and agreed to proceed.  Participating parties in this visit include: the patient and nurse practitioner listed  The patient is: at home I am: in the office  Subjective:    CC:  Chief Complaint  Patient presents with  . Sinusitis    HPI: [name] is a 44 y.o. y/o female presenting via MyChart today for sinus pain and pressure.  Sx onset: Over the weekend- started as allergy like symptoms then progressed Sx present: severe sinus pain in the frontal and maxillary region, R>L with pain in the upper teeth and sore throat Negative sx: cough, fever, chills, body aches, LoT, LoS Treatments tried: nothing   Past medical history, Surgical history, Family history not pertinant except as noted below, Social history, Allergies, and medications have been entered into the medical record, reviewed, and corrections made.   Review of Systems:  All review of systems negative except what is listed in the HPI   Objective:    General: Speaking clearly in complete sentences without any shortness of breath.   Audibly congested.  Alert and oriented x3.   Normal judgment.  No apparent acute distress.   Impression and Recommendations:   1. Acute non-recurrent pansinusitis Symptoms consistent with acute pansinusitis. Given the severity of symptoms and associated pain into the teeth, we will begin antibiotic treatment today with Augmentin.  Discussed OTC treatment measures that may be additionally helpful for symptoms.  Monitor for worsening symptoms  or symptom improvement then worsening.  - amoxicillin-clavulanate (AUGMENTIN) 875-125 MG tablet; Take 1 tablet by mouth 2 (two) times daily.  Dispense: 10 tablet; Refill: 0  Return if symptoms worsen or fail to improve.     I discussed the assessment and treatment plan with the patient. The patient was provided an opportunity to ask questions and all were answered. The patient agreed with the plan and demonstrated an understanding of the instructions.   The patient was advised to call back or seek an in-person evaluation if the symptoms worsen or if the condition fails to improve as anticipated.  I provided 10 minutes of non-face-to-face interaction with this MYCHART visit including intake, same-day documentation, and chart review.   Tollie Eth, NP

## 2020-04-08 ENCOUNTER — Telehealth (INDEPENDENT_AMBULATORY_CARE_PROVIDER_SITE_OTHER): Payer: Self-pay | Admitting: Physician Assistant

## 2020-04-08 ENCOUNTER — Encounter: Payer: Self-pay | Admitting: Physician Assistant

## 2020-04-08 ENCOUNTER — Telehealth: Payer: Self-pay

## 2020-04-08 VITALS — Wt 147.0 lb

## 2020-04-08 DIAGNOSIS — R0981 Nasal congestion: Secondary | ICD-10-CM

## 2020-04-08 DIAGNOSIS — Z20822 Contact with and (suspected) exposure to covid-19: Secondary | ICD-10-CM

## 2020-04-08 MED ORDER — METHYLPREDNISOLONE 4 MG PO TBPK: ORAL_TABLET | ORAL | 0 refills | Status: DC

## 2020-04-08 NOTE — Progress Notes (Signed)
Pt has sinus infection she tested neg just having congestion for a wk no OTC medication.

## 2020-04-08 NOTE — Progress Notes (Signed)
Patient ID: [name], female   DOB: [birth year], 45 y.o.   MRN: [id number] .Marland KitchenVirtual Visit via Video Note  I connected with Ginamarie Kasler on 04/08/20 at  1:00 PM EST by a video enabled telemedicine application and verified that I am speaking with the correct person using two identifiers.  Location: Patient: home Provider: clinic  .Marland KitchenParticipating in visit:  [name] Provider: Tandy Gaw PA-C   I discussed the limitations of evaluation and management by telemedicine and the availability of in person appointments. The patient expressed understanding and agreed to proceed.  History of Present Illness: Pt is a 45 yo female who calls into the clinic with sinus pressure and congestion that is ongoing. She started with ST right after christmas and then treated for sinus infection on 12/30. Her sinus pressure is better but her severe nasal congestion is present. Cough better. Husband positive for covid. She has had vaccine and tested negative once for covid. Finished augmentin a few days ago. It did help with the productive sputum but not the pressure. No SOB, fever, chills, body aches.   .. Active Ambulatory Problems    Diagnosis Date Noted  . Chest pain 10/16/2013  . Hyperlipidemia 10/16/2013  . GAD (generalized anxiety disorder) 02/03/2015  . OCD (obsessive compulsive disorder) 02/03/2015  . History of HPV infection 03/06/2015  . Cervical dysplasia 03/06/2015  . Trouble in sleeping 03/06/2015  . Menorrhagia 05/18/2015  . Depression 12/11/2017  . Sinusitis 08/01/2019  . Panic attack 11/27/2019   Resolved Ambulatory Problems    Diagnosis Date Noted  . No Resolved Ambulatory Problems   Past Medical History:  Diagnosis Date  . Abnormal Pap smear   . Frequent UTI   . Hx of varicella   . Kidney stones    Reviewed med, allergies, problem list.    Observations/Objective: Normal mood and appearance No labored breathing, cough or wheezing Pale appearance.   .. Today's  Vitals   04/08/20 1155  Weight: 147 lb (66.7 kg)   Body mass index is 24.46 kg/m.     Assessment and Plan: Marland KitchenMarland KitchenDiagnoses and all orders for this visit:  Nasal congestion -     methylPREDNISolone (MEDROL DOSEPAK) 4 MG TBPK tablet; Take as directed by package insert.  Close exposure to COVID-19 virus   Strongly suspect she got covid on top of whatever she had before. Quarantine with husband and family.  Marland Kitchen.Vitamin D3 5000 IU (125 mcg) daily Vitamin C 500 mg twice daily Zinc 50 to 75 mg daily Started medrol dose pak for inflammation.  Ok to use flonase Rest and hydrate. Follow up as needed.    Follow Up Instructions:    I discussed the assessment and treatment plan with the patient. The patient was provided an opportunity to ask questions and all were answered. The patient agreed with the plan and demonstrated an understanding of the instructions.   The patient was advised to call back or seek an in-person evaluation if the symptoms worsen or if the condition fails to improve as anticipated.    Tandy Gaw, PA-C

## 2020-04-08 NOTE — Telephone Encounter (Signed)
error 

## 2020-06-01 ENCOUNTER — Other Ambulatory Visit: Payer: Self-pay | Admitting: Physician Assistant

## 2020-06-01 DIAGNOSIS — F429 Obsessive-compulsive disorder, unspecified: Secondary | ICD-10-CM

## 2020-06-01 DIAGNOSIS — F411 Generalized anxiety disorder: Secondary | ICD-10-CM

## 2020-06-01 DIAGNOSIS — F325 Major depressive disorder, single episode, in full remission: Secondary | ICD-10-CM

## 2020-06-14 ENCOUNTER — Other Ambulatory Visit: Payer: Self-pay | Admitting: Physician Assistant

## 2020-06-14 DIAGNOSIS — F325 Major depressive disorder, single episode, in full remission: Secondary | ICD-10-CM

## 2020-06-14 DIAGNOSIS — F411 Generalized anxiety disorder: Secondary | ICD-10-CM

## 2020-06-14 DIAGNOSIS — F429 Obsessive-compulsive disorder, unspecified: Secondary | ICD-10-CM

## 2020-09-22 ENCOUNTER — Other Ambulatory Visit: Payer: Self-pay | Admitting: Physician Assistant

## 2020-09-22 DIAGNOSIS — F411 Generalized anxiety disorder: Secondary | ICD-10-CM

## 2020-09-22 DIAGNOSIS — F429 Obsessive-compulsive disorder, unspecified: Secondary | ICD-10-CM

## 2020-09-22 DIAGNOSIS — F325 Major depressive disorder, single episode, in full remission: Secondary | ICD-10-CM

## 2020-10-06 ENCOUNTER — Other Ambulatory Visit: Payer: Self-pay | Admitting: Physician Assistant

## 2020-10-06 DIAGNOSIS — F411 Generalized anxiety disorder: Secondary | ICD-10-CM

## 2020-10-06 DIAGNOSIS — F429 Obsessive-compulsive disorder, unspecified: Secondary | ICD-10-CM

## 2020-10-06 DIAGNOSIS — F325 Major depressive disorder, single episode, in full remission: Secondary | ICD-10-CM

## 2020-10-29 ENCOUNTER — Other Ambulatory Visit: Payer: Self-pay | Admitting: Physician Assistant

## 2020-10-29 DIAGNOSIS — F429 Obsessive-compulsive disorder, unspecified: Secondary | ICD-10-CM

## 2020-10-29 DIAGNOSIS — F411 Generalized anxiety disorder: Secondary | ICD-10-CM

## 2020-10-29 DIAGNOSIS — F325 Major depressive disorder, single episode, in full remission: Secondary | ICD-10-CM

## 2020-10-29 NOTE — Telephone Encounter (Signed)
Patient needs appt

## 2020-10-29 NOTE — Telephone Encounter (Signed)
Appointment has been made for Aug 2nd with pcp - AM

## 2020-11-03 ENCOUNTER — Encounter: Payer: Self-pay | Admitting: Physician Assistant

## 2020-11-03 ENCOUNTER — Ambulatory Visit (INDEPENDENT_AMBULATORY_CARE_PROVIDER_SITE_OTHER): Payer: Self-pay | Admitting: Physician Assistant

## 2020-11-03 VITALS — BP 109/60 | HR 72 | Temp 98.5°F | Ht 65.0 in | Wt 147.0 lb

## 2020-11-03 DIAGNOSIS — R0981 Nasal congestion: Secondary | ICD-10-CM

## 2020-11-03 DIAGNOSIS — F429 Obsessive-compulsive disorder, unspecified: Secondary | ICD-10-CM

## 2020-11-03 DIAGNOSIS — F410 Panic disorder [episodic paroxysmal anxiety] without agoraphobia: Secondary | ICD-10-CM

## 2020-11-03 DIAGNOSIS — Z131 Encounter for screening for diabetes mellitus: Secondary | ICD-10-CM

## 2020-11-03 DIAGNOSIS — Z13220 Encounter for screening for lipoid disorders: Secondary | ICD-10-CM

## 2020-11-03 DIAGNOSIS — F411 Generalized anxiety disorder: Secondary | ICD-10-CM

## 2020-11-03 DIAGNOSIS — R6889 Other general symptoms and signs: Secondary | ICD-10-CM | POA: Insufficient documentation

## 2020-11-03 DIAGNOSIS — F325 Major depressive disorder, single episode, in full remission: Secondary | ICD-10-CM

## 2020-11-03 DIAGNOSIS — Z1329 Encounter for screening for other suspected endocrine disorder: Secondary | ICD-10-CM

## 2020-11-03 MED ORDER — BUSPIRONE HCL 7.5 MG PO TABS: 7.5000 mg | ORAL_TABLET | Freq: Three times a day (TID) | ORAL | 3 refills | Status: DC

## 2020-11-03 MED ORDER — SERTRALINE HCL 50 MG PO TABS: 50.0000 mg | ORAL_TABLET | Freq: Every day | ORAL | 3 refills | Status: DC

## 2020-11-03 NOTE — Progress Notes (Signed)
Subjective:    Patient ID: [name], female    DOB: 06-14-1975, 45 y.o.   MRN: [id number]  HPI Patient is a 45 year old female with MDD, GAD, OCD, panic attacks who presents to the clinic for medication refill.  She thought about going off medication altogether but I convinced her to do a slow taper.  She decrease Zoloft down to 50 and has noticed that she definitely is more anxious.  She would like to stay on this dose though.  She likes being a little more anxious over not caring about anything.  It does also increase her sex drive.  She feels like she is able to control it along with BuSpar up to 3 times a day.  She does mention wanting her screening labs and that she is having a little more cold intolerance and wants her thyroid checked.   .. 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  Objective:   Physical Exam Vitals reviewed.  Constitutional:      Appearance: Normal appearance.  HENT:     Head: Normocephalic.  Cardiovascular:     Rate and Rhythm: Normal rate and regular rhythm.     Pulses: Normal pulses.     Heart sounds: Normal heart sounds.  Pulmonary:     Effort: Pulmonary effort is normal.     Breath sounds: Normal breath sounds.  Musculoskeletal:     Cervical back: No tenderness.  Lymphadenopathy:     Cervical: No cervical  adenopathy.  Neurological:     General: No focal deficit present.     Mental Status: She is alert and oriented to person, place, and time.  Psychiatric:        Mood and Affect: Mood normal.      .. Depression screen [facility] 2/9 11/03/2020 11/27/2019 02/19/2019 08/28/2018 04/17/2018  Decreased Interest 0 0 0 0 1  Down, Depressed, Hopeless 0 0 0 0 1  PHQ - 2 Score 0 0 0 0 2  Altered sleeping 2 3 1  - 1  Tired, decreased energy 2 1 1  - 1  Change in appetite 0 0 0 - 0  Feeling bad or failure about yourself  0 0 0 - 0  Trouble concentrating 2 0 0 - 0  Moving slowly or fidgety/restless 0 0 0 - 0  Suicidal thoughts 0 0 0 - 0  PHQ-9 Score 6 4 2  - 4  Difficult doing work/chores Not difficult at all Not difficult at all Not difficult at all - Not difficult at all   . GAD 7 : Generalized Anxiety Score 11/03/2020 11/27/2019 02/19/2019 08/28/2018  Nervous, Anxious, on Edge 2 1 2 1   Control/stop worrying 1 3 2 1   Worry too much - different things 2 3 2 1   Trouble relaxing 1 1  1 1  Restless 2 0 1 1  Easily annoyed or irritable 2 1 2 1   Afraid - awful might happen 2 3 2  0  Total GAD 7 Score 12 12 12 6   Anxiety Difficulty Somewhat difficult Not difficult at all Somewhat difficult Somewhat difficult        Assessment & Plan:  .  Saige was seen today for anxiety, depression and obsessive compulsive disorder.  Diagnoses and all orders for this visit:  Major depressive disorder in full remission, unspecified whether recurrent (HCC) -     sertraline (ZOLOFT) 50 MG tablet; Take 1 tablet (50 mg total) by mouth daily.  Nasal congestion  GAD (generalized anxiety disorder) -     sertraline (ZOLOFT) 50 MG tablet; Take 1 tablet (50 mg total) by mouth daily. -     busPIRone (BUSPAR) 7.5 MG tablet; Take 1 tablet (7.5 mg total) by mouth 3 (three) times daily. -     COMPLETE METABOLIC PANEL WITH GFR  Obsessive-compulsive disorder, unspecified type -     sertraline (ZOLOFT) 50 MG tablet; Take 1 tablet (50  mg total) by mouth daily.  Panic attack -     busPIRone (BUSPAR) 7.5 MG tablet; Take 1 tablet (7.5 mg total) by mouth 3 (three) times daily.  Screening for diabetes mellitus -     COMPLETE METABOLIC PANEL WITH GFR  Screening for lipid disorders -     Lipid Panel w/reflex Direct LDL  Thyroid disorder screening -     TSH  Cold intolerance -     TSH  PHQ/GAD up some but would like to stay at this dose of zoloft. She can "feel" more but not as anxious as she was before going on medication.  Continue zoloft and buspar.  Using xanax very sparingly. Does not need refills.   Screening labs ordered.   Pt sees GYN for pap and mammogram. No family hx of BC.

## 2020-11-03 NOTE — Patient Instructions (Signed)
Health Maintenance, Female Adopting a healthy lifestyle and getting preventive care are important in promoting health and wellness. Ask your health care provider about: The right schedule for you to have regular tests and exams. Things you can do on your own to prevent diseases and keep yourself healthy. What should I know about diet, weight, and exercise? Eat a healthy diet  Eat a diet that includes plenty of vegetables, fruits, low-fat dairy products, and lean protein. Do not eat a lot of foods that are high in solid fats, added sugars, or sodium.  Maintain a healthy weight Body mass index (BMI) is used to identify weight problems. It estimates body fat based on height and weight. Your health care provider can help determineyour BMI and help you achieve or maintain a healthy weight. Get regular exercise Get regular exercise. This is one of the most important things you can do for your health. Most adults should: Exercise for at least 150 minutes each week. The exercise should increase your heart rate and make you sweat (moderate-intensity exercise). Do strengthening exercises at least twice a week. This is in addition to the moderate-intensity exercise. Spend less time sitting. Even light physical activity can be beneficial. Watch cholesterol and blood lipids Have your blood tested for lipids and cholesterol at 43 years of age, then havethis test every 5 years. Have your cholesterol levels checked more often if: Your lipid or cholesterol levels are high. You are older than 43 years of age. You are at high risk for heart disease. What should I know about cancer screening? Depending on your health history and family history, you may need to have cancer screening at various ages. This may include screening for: Breast cancer. Cervical cancer. Colorectal cancer. Skin cancer. Lung cancer. What should I know about heart disease, diabetes, and high blood pressure? Blood pressure and heart  disease High blood pressure causes heart disease and increases the risk of stroke. This is more likely to develop in people who have high blood pressure readings, are of African descent, or are overweight. Have your blood pressure checked: Every 3-5 years if you are 18-39 years of age. Every year if you are 40 years old or older. Diabetes Have regular diabetes screenings. This checks your fasting blood sugar level. Have the screening done: Once every three years after age 40 if you are at a normal weight and have a low risk for diabetes. More often and at a younger age if you are overweight or have a high risk for diabetes. What should I know about preventing infection? Hepatitis B If you have a higher risk for hepatitis B, you should be screened for this virus. Talk with your health care provider to find out if you are at risk forhepatitis B infection. Hepatitis C Testing is recommended for: Everyone born from 1945 through 1965. Anyone with known risk factors for hepatitis C. Sexually transmitted infections (STIs) Get screened for STIs, including gonorrhea and chlamydia, if: You are sexually active and are younger than 43 years of age. You are older than 43 years of age and your health care provider tells you that you are at risk for this type of infection. Your sexual activity has changed since you were last screened, and you are at increased risk for chlamydia or gonorrhea. Ask your health care provider if you are at risk. Ask your health care provider about whether you are at high risk for HIV. Your health care provider may recommend a prescription medicine to help   prevent HIV infection. If you choose to take medicine to prevent HIV, you should first get tested for HIV. You should then be tested every 3 months for as long as you are taking the medicine. Pregnancy If you are about to stop having your period (premenopausal) and you may become pregnant, seek counseling before you get  pregnant. Take 400 to 800 micrograms (mcg) of folic acid every day if you become pregnant. Ask for birth control (contraception) if you want to prevent pregnancy. Osteoporosis and menopause Osteoporosis is a disease in which the bones lose minerals and strength with aging. This can result in bone fractures. If you are 65 years old or older, or if you are at risk for osteoporosis and fractures, ask your health care provider if you should: Be screened for bone loss. Take a calcium or vitamin D supplement to lower your risk of fractures. Be given hormone replacement therapy (HRT) to treat symptoms of menopause. Follow these instructions at home: Lifestyle Do not use any products that contain nicotine or tobacco, such as cigarettes, e-cigarettes, and chewing tobacco. If you need help quitting, ask your health care provider. Do not use street drugs. Do not share needles. Ask your health care provider for help if you need support or information about quitting drugs. Alcohol use Do not drink alcohol if: Your health care provider tells you not to drink. You are pregnant, may be pregnant, or are planning to become pregnant. If you drink alcohol: Limit how much you use to 0-1 drink a day. Limit intake if you are breastfeeding. Be aware of how much alcohol is in your drink. In the [place] one drink equals one 12 oz bottle of beer (355 mL), one 5 oz glass of wine (148 mL), or one 1 oz glass of hard liquor (44 mL). General instructions Schedule regular health, dental, and eye exams. Stay current with your vaccines. Tell your health care provider if: You often feel depressed. You have ever been abused or do not feel safe at home. Summary Adopting a healthy lifestyle and getting preventive care are important in promoting health and wellness. Follow your health care provider's instructions about healthy diet, exercising, and getting tested or screened for diseases. Follow your health care provider's  instructions on monitoring your cholesterol and blood pressure. This information is not intended to replace advice given to you by your health care provider. Make sure you discuss any questions you have with your healthcare provider. Document Revised: 03/14/2018 Document Reviewed: 03/14/2018 Elsevier Patient Education  2022 [employer] Inc.  

## 2020-11-19 ENCOUNTER — Other Ambulatory Visit: Payer: Self-pay | Admitting: Obstetrics and Gynecology

## 2020-11-20 ENCOUNTER — Other Ambulatory Visit: Payer: Self-pay | Admitting: Radiology

## 2020-11-20 DIAGNOSIS — N63 Unspecified lump in unspecified breast: Secondary | ICD-10-CM

## 2020-11-26 ENCOUNTER — Ambulatory Visit
Admission: RE | Admit: 2020-11-26 | Discharge: 2020-11-26 | Disposition: A | Discharge status: Home / Self Care | Payer: Self-pay | Source: Ambulatory Visit | Attending: Radiology | Admitting: Radiology

## 2020-11-26 ENCOUNTER — Other Ambulatory Visit: Payer: Self-pay

## 2020-11-26 ENCOUNTER — Ambulatory Visit
Admission: RE | Admit: 2020-11-26 | Discharge: 2020-11-26 | Disposition: A | Discharge status: Home / Self Care | Payer: No Typology Code available for payment source | Source: Ambulatory Visit | Attending: Radiology | Admitting: Radiology

## 2020-11-26 DIAGNOSIS — N63 Unspecified lump in unspecified breast: Secondary | ICD-10-CM

## 2020-12-04 ENCOUNTER — Other Ambulatory Visit: Payer: Self-pay

## 2020-12-24 ENCOUNTER — Encounter: Payer: Self-pay | Admitting: Physician Assistant

## 2020-12-25 ENCOUNTER — Telehealth (INDEPENDENT_AMBULATORY_CARE_PROVIDER_SITE_OTHER): Payer: BC Managed Care – PPO | Admitting: Physician Assistant

## 2020-12-25 ENCOUNTER — Encounter: Payer: Self-pay | Admitting: Physician Assistant

## 2020-12-25 ENCOUNTER — Other Ambulatory Visit: Payer: Self-pay

## 2020-12-25 ENCOUNTER — Telehealth: Payer: No Typology Code available for payment source | Admitting: Physician Assistant

## 2020-12-25 VITALS — Ht 65.0 in | Wt 147.0 lb

## 2020-12-25 DIAGNOSIS — J0100 Acute maxillary sinusitis, unspecified: Secondary | ICD-10-CM

## 2020-12-25 DIAGNOSIS — J3089 Other allergic rhinitis: Secondary | ICD-10-CM

## 2020-12-25 MED ORDER — LEVOCETIRIZINE DIHYDROCHLORIDE 5 MG PO TABS: 5.0000 mg | ORAL_TABLET | Freq: Every evening | ORAL | 3 refills | Status: DC

## 2020-12-25 MED ORDER — AMOXICILLIN-POT CLAVULANATE 875-125 MG PO TABS: 1.0000 | ORAL_TABLET | Freq: Two times a day (BID) | ORAL | 0 refills | Status: AC

## 2020-12-25 MED ORDER — METHYLPREDNISOLONE 4 MG PO TBPK: ORAL_TABLET | ORAL | 0 refills | Status: DC

## 2020-12-25 MED ORDER — AMOXICILLIN-POT CLAVULANATE 875-125 MG PO TABS: 1.0000 | ORAL_TABLET | Freq: Two times a day (BID) | ORAL | 0 refills | Status: DC

## 2020-12-25 MED ORDER — MONTELUKAST SODIUM 10 MG PO TABS: 10.0000 mg | ORAL_TABLET | Freq: Every day | ORAL | 3 refills | Status: DC

## 2020-12-25 NOTE — Telephone Encounter (Signed)
See additional note.  Tiajuana Amass, CMA

## 2020-12-25 NOTE — Progress Notes (Signed)
Allergy issues Pain/pressure in teeth

## 2020-12-25 NOTE — Progress Notes (Signed)
Received MyChart message from pt stating that the pharmacy did not receive RXs for Augmentin, levocetirizine, or Medrol Dosepak.  Confirmed that system showed the RXs were sent, but not confirmed received by the pharmacy.  Resent RXs and received confirmation of receipt from the pharmacy.  Tiajuana Amass, CMA

## 2020-12-25 NOTE — Progress Notes (Signed)
Patient ID: [name], female   DOB: 1975-09-29, 45 y.o.   MRN: [id number] .Marland KitchenVirtual Visit via Video Note  I connected with [name] on 12/25/20 at  3:20 PM EDT by a video enabled telemedicine application and verified that I am speaking with the correct person using two identifiers.  Location: Patient: car Provider: clinic  .Marland KitchenParticipating in visit:  [name] Provider: Tandy Gaw PA-C    I discussed the limitations of evaluation and management by telemedicine and the availability of in person appointments. The patient expressed understanding and agreed to proceed.  History of Present Illness: Patient is a 45 year old female who has a history of seasonal allergies who presents to the clinic with about 6 to 8 weeks of allergy symptoms that seem to be worsening over the last week week and a half.  This seems to be happening yearly about this time year.  She has lots of sinus pressure and also blowing out green sputum.  Her ears are popping.  She has bilateral facial pressure in her cheeks and forehead.  She has a mild sore throat.  She denies any cough, shortness of breath, wheezing, fever, chills, body ache.  No other sick contacts.  She is taking Zyrtec right now but in the past is taking Allegra.  She feels like neither one of them have really helped her this year.  She did add Flonase for a little bit and it did not seem to make a difference either she is going to add her Singulair with an allergy medication and see if that helps.   .. 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   .. Today's Vitals   12/25/20 1503  Weight: 147 lb (66.7 kg)  Height: 5\' 5"  (1.651 m)   Body mass index is 24.46 kg/m.     Assessment and Plan: Marland KitchenApril was seen today for sinus problem.  Diagnoses and all orders for this visit:  Subacute maxillary sinusitis -     amoxicillin-clavulanate (AUGMENTIN) 875-125 MG tablet; Take 1 tablet by mouth 2 (two) times daily for 10 days. -     methylPREDNISolone (MEDROL DOSEPAK) 4 MG TBPK tablet; Take as directed by package insert. -     levocetirizine (XYZAL) 5 MG tablet; Take 1 tablet (5 mg total) by mouth every evening.  Seasonal allergic rhinitis due to other allergic trigger -     levocetirizine (XYZAL) 5 MG tablet; Take 1 tablet (5 mg total) by mouth every evening.  At this point we can treat for subacute sinusitis likely due to her ongoing allergies.  Augmentin was sent to the pharmacy.  She may actually have to add Medrol Dosepak as well.  She can add this if she is not feeling any better.  For maintenance try  Xyzal, Singulair, Flonase and see if that helps her with her fall allergies.  Follow-up as needed or if symptoms change or worsen.  Follow Up Instructions:    I discussed the assessment and treatment plan with the patient. The patient was provided an opportunity to ask questions and all were answered. The patient agreed with the plan and demonstrated an understanding of the instructions.   The patient was advised to call back or seek an in-person evaluation if the symptoms worsen or if the condition fails to improve as anticipated.    Tandy Gaw, PA-C

## 2020-12-28 ENCOUNTER — Encounter: Payer: Self-pay | Admitting: Physician Assistant

## 2020-12-28 MED ORDER — BENZONATATE 200 MG PO CAPS: 200.0000 mg | ORAL_CAPSULE | Freq: Three times a day (TID) | ORAL | 0 refills | Status: DC | PRN

## 2020-12-28 MED ORDER — HYDROCODONE BIT-HOMATROP MBR 5-1.5 MG/5ML PO SOLN: 5.0000 mL | Freq: Three times a day (TID) | ORAL | 0 refills | Status: DC | PRN

## 2020-12-29 ENCOUNTER — Telehealth: Payer: No Typology Code available for payment source | Admitting: Physician Assistant

## 2021-01-17 ENCOUNTER — Encounter: Payer: Self-pay | Admitting: Physician Assistant

## 2021-04-06 ENCOUNTER — Other Ambulatory Visit: Payer: Self-pay | Admitting: Nurse Practitioner

## 2021-04-06 DIAGNOSIS — N6310 Unspecified lump in the right breast, unspecified quadrant: Secondary | ICD-10-CM

## 2021-04-14 ENCOUNTER — Ambulatory Visit
Admission: RE | Admit: 2021-04-14 | Discharge: 2021-04-14 | Disposition: A | Discharge status: Home / Self Care | Payer: BC Managed Care – PPO | Source: Ambulatory Visit | Attending: Nurse Practitioner | Admitting: Nurse Practitioner

## 2021-04-14 DIAGNOSIS — N6310 Unspecified lump in the right breast, unspecified quadrant: Secondary | ICD-10-CM

## 2021-05-05 ENCOUNTER — Other Ambulatory Visit: Payer: BC Managed Care – PPO

## 2021-07-16 ENCOUNTER — Encounter: Payer: Self-pay | Admitting: Physician Assistant

## 2021-07-16 DIAGNOSIS — F410 Panic disorder [episodic paroxysmal anxiety] without agoraphobia: Secondary | ICD-10-CM

## 2021-07-16 MED ORDER — ALPRAZOLAM 0.5 MG PO TABS: 0.5000 mg | ORAL_TABLET | Freq: Two times a day (BID) | ORAL | 0 refills | Status: DC | PRN

## 2021-08-09 ENCOUNTER — Other Ambulatory Visit: Payer: Self-pay | Admitting: Obstetrics and Gynecology

## 2021-08-09 DIAGNOSIS — N6019 Diffuse cystic mastopathy of unspecified breast: Secondary | ICD-10-CM

## 2021-08-12 ENCOUNTER — Encounter: Payer: Self-pay | Admitting: Family Medicine

## 2021-08-12 ENCOUNTER — Telehealth: Payer: BC Managed Care – PPO | Admitting: Family Medicine

## 2021-08-12 VITALS — Ht 65.0 in | Wt 145.0 lb

## 2021-08-12 DIAGNOSIS — J0100 Acute maxillary sinusitis, unspecified: Secondary | ICD-10-CM

## 2021-08-12 MED ORDER — AZITHROMYCIN 250 MG PO TABS: ORAL_TABLET | ORAL | 0 refills | Status: AC

## 2021-08-12 NOTE — Progress Notes (Signed)
? ? ?  Virtual Visit via Video Note ? ?I connected with [name] on 08/12/21 at 11:30 AM EDT by a video enabled telemedicine application and verified that I am speaking with the correct person using two identifiers. ?  ?I discussed the limitations of evaluation and management by telemedicine and the availability of in person appointments. The patient expressed understanding and agreed to proceed. ? ?Patient location: at home ?Provider location: in office ? ?Subjective:   ? ?CC:   ?Chief Complaint  ?Patient presents with  ? Nasal Congestion  ?  Nasal congestion/Bloody discharge, cough, head congestion, ears popping. Facial pressure/pain, 7-8 days.   ? ? ?HPI: ?Started with blood nasal d/c for about 4 days.  Nasal congestion/Bloody discharge, cough, head congestion, ears popping. Facial pressure/pain, 7-8 days. Ears itching as well.  On Singulair daily.  + drainage.  Right maxillary pain and pressure worse this AM. No GI sxs.  + nausea.  Fells worse today, Unable to go work today.  ? ? ? ?Past medical history, Surgical history, Family history not pertinant except as noted below, Social history, Allergies, and medications have been entered into the medical record, reviewed, and corrections made.  ? ? ?Objective:   ? ?General: Speaking clearly in complete sentences without any shortness of breath.  Alert and oriented x3.  Normal judgment. No apparent acute distress. ? ? ? ?Impression and Recommendations:   ? ?Problem List Items Addressed This Visit   ? ?  ? Respiratory  ? Sinusitis - Primary  ? Relevant Medications  ? azithromycin (ZITHROMAX) 250 MG tablet  ? ?Acute sinusitis -at this point she has had symptoms for about 8 days.  I do think the initial bloody discharge might be from her Flonase which she uses year-round so we did discuss taking a break from that medication and that it can cause nosebleeds with prolonged use should just need to take a break for a while and then okay to restart.  Continue daily  Singulair.  Recommend nasal saline irrigation/rinse.  Recommend running a humidifier at night.  And we will treat with azithromycin.  If not significantly better in 1 week please give [place] a call back.  Work note provided. ? ?No orders of the defined types were placed in this encounter. ? ? ?Meds ordered this encounter  ?Medications  ? azithromycin (ZITHROMAX) 250 MG tablet  ?  Sig: 2 Ttabs PO on Day 1, then one a day x 4 days.  ?  Dispense:  6 tablet  ?  Refill:  0  ? ? ? ?I discussed the assessment and treatment plan with the patient. The patient was provided an opportunity to ask questions and all were answered. The patient agreed with the plan and demonstrated an understanding of the instructions. ?  ?The patient was advised to call back or seek an in-person evaluation if the symptoms worsen or if the condition fails to improve as anticipated. ? ? ?Nani Gasser, MD  ? ?

## 2021-08-18 ENCOUNTER — Other Ambulatory Visit: Payer: Self-pay | Admitting: Family Medicine

## 2021-08-18 DIAGNOSIS — F410 Panic disorder [episodic paroxysmal anxiety] without agoraphobia: Secondary | ICD-10-CM

## 2021-09-27 ENCOUNTER — Ambulatory Visit: Payer: BC Managed Care – PPO

## 2021-10-07 ENCOUNTER — Ambulatory Visit: Payer: BC Managed Care – PPO

## 2021-10-27 ENCOUNTER — Encounter: Payer: Self-pay | Admitting: Physician Assistant

## 2021-11-03 ENCOUNTER — Encounter: Payer: Self-pay | Admitting: Neurology

## 2021-11-17 ENCOUNTER — Ambulatory Visit: Payer: BC Managed Care – PPO | Admitting: Physician Assistant

## 2021-11-17 ENCOUNTER — Encounter: Payer: Self-pay | Admitting: Physician Assistant

## 2021-11-17 VITALS — BP 101/47 | HR 79 | Ht 65.0 in | Wt 148.0 lb

## 2021-11-17 DIAGNOSIS — Z1329 Encounter for screening for other suspected endocrine disorder: Secondary | ICD-10-CM

## 2021-11-17 DIAGNOSIS — Z131 Encounter for screening for diabetes mellitus: Secondary | ICD-10-CM | POA: Diagnosis not present

## 2021-11-17 DIAGNOSIS — Z13220 Encounter for screening for lipoid disorders: Secondary | ICD-10-CM

## 2021-11-17 DIAGNOSIS — G4700 Insomnia, unspecified: Secondary | ICD-10-CM

## 2021-11-17 DIAGNOSIS — F325 Major depressive disorder, single episode, in full remission: Secondary | ICD-10-CM

## 2021-11-17 DIAGNOSIS — F411 Generalized anxiety disorder: Secondary | ICD-10-CM

## 2021-11-17 DIAGNOSIS — Z79899 Other long term (current) drug therapy: Secondary | ICD-10-CM | POA: Diagnosis not present

## 2021-11-17 DIAGNOSIS — F429 Obsessive-compulsive disorder, unspecified: Secondary | ICD-10-CM

## 2021-11-17 MED ORDER — BELSOMRA 10 MG PO TABS: 1.0000 | ORAL_TABLET | Freq: Every day | ORAL | 1 refills | Status: DC

## 2021-11-17 MED ORDER — SERTRALINE HCL 50 MG PO TABS: ORAL_TABLET | ORAL | 0 refills | Status: DC

## 2021-11-17 NOTE — Patient Instructions (Signed)
Belsomnra 10 at bedtime.  Zoloft increase to 75mg .

## 2021-11-17 NOTE — Progress Notes (Signed)
Established Patient Office Visit  Subjective   Patient ID: [name], female    DOB: 09/24/1975  Age: 46 y.o. MRN: [id number]  Chief Complaint  Patient presents with   Follow-up   Anxiety    HPI Pt is a 46 yo female with OCD, GAD, insomnia who presents to the clinic for follow up. She wants a medication change.   She has weaned herself off xanax because she was using it nightly for sleep. Now she cannot sleep at all. Her anxiety and concentration are not controlled. She feels overwhelmed very easily and worried. When she takes 100mg  of zoloft she is "too numb". When she takes 50mg  of zoloft "she struggles again with anxiety". No SI/HC. 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    ROS See HPI.    Objective:     BP (!) 101/47   Pulse 79   Ht 5\' 5"  (1.651 m)   Wt 148 lb (67.1 kg)   SpO2 100%   BMI 24.63 kg/m  BP Readings from Last 3 Encounters:  11/17/21 (!) 101/47  11/03/20 109/60  11/27/19 118/76   Wt Readings from Last 3 Encounters:  11/17/21 148 lb (67.1 kg)  08/12/21 145 lb (65.8 kg)  12/25/20 147 lb (66.7 kg)    ..    11/17/2021    2:40 PM 11/03/2020   11:38 AM 11/27/2019    8:33 AM 02/19/2019    9:27 AM 08/28/2018    1:08 PM  Depression screen PHQ 2/9  Decreased Interest 1 0 0 0 0  Down, Depressed, Hopeless 1 0 0 0 0  PHQ - 2 Score 2 0 0 0 0  Altered sleeping 2 2 3 1    Tired, decreased energy 1 2 1 1    Change in appetite 0 0 0 0   Feeling bad or failure about yourself  0 0 0 0   Trouble concentrating 1 2 0 0   Moving slowly or fidgety/restless 0 0 0 0   Suicidal thoughts 0 0 0 0   PHQ-9 Score 6 6 4 2    Difficult doing work/chores Somewhat difficult Not difficult at all Not difficult at all Not difficult at all    .02/21/2019    11/17/2021    2:40 PM 11/03/2020   11:40 AM 11/27/2019    8:36 AM 02/19/2019    9:28 AM  GAD 7 : Generalized Anxiety Score  Nervous, Anxious, on Edge 3 2 1 2   Control/stop worrying 3 1 3 2   Worry too much - different things 3 2 3 2    Trouble relaxing 3 1 1 1   Restless 3 2 0 1  Easily annoyed or irritable 3 2 1 2   Afraid - awful might happen 3 2 3 2   Total GAD 7 Score 21 12 12 12   Anxiety Difficulty Somewhat difficult Somewhat difficult Not difficult at all Somewhat difficult      Physical Exam Constitutional:      Appearance: Normal appearance.  Cardiovascular:     Rate and Rhythm: Normal rate and regular rhythm.  Pulmonary:     Effort: Pulmonary effort is normal.     Breath sounds: Normal breath sounds.  Neurological:     General: No focal deficit present.     Mental Status: She is alert and oriented to person, place, and time.  Psychiatric:        Mood and Affect: Mood normal.           Assessment &  Plan:  Marland KitchenMarland KitchenApril was seen today for follow-up and anxiety.  Diagnoses and all orders for this visit:  Screening for diabetes mellitus -     COMPLETE METABOLIC PANEL WITH GFR  Screening for lipid disorders -     Lipid Panel w/reflex Direct LDL  Thyroid disorder screening -     TSH  Medication management -     TSH -     Lipid Panel w/reflex Direct LDL -     COMPLETE METABOLIC PANEL WITH GFR -     CBC with Differential/Platelet  GAD (generalized anxiety disorder) -     sertraline (ZOLOFT) 50 MG tablet; Take one and one-half tablet daily.  Major depressive disorder in full remission, unspecified whether recurrent (HCC) -     sertraline (ZOLOFT) 50 MG tablet; Take one and one-half tablet daily.  Obsessive-compulsive disorder, unspecified type -     sertraline (ZOLOFT) 50 MG tablet; Take one and one-half tablet daily.  Insomnia, unspecified type -     Suvorexant (BELSOMRA) 10 MG TABS; Take 1 tablet by mouth at bedtime.   Increase zoloft to 75mg  in between 50 and 100mg . Start belsomra for sleep. Follow up in 6-8 weeks.  Ok to me update in 3-4 weeks.    Return in about 3 months (around 02/17/2022), or if symptoms worsen or fail to improve.    [employer], PA-C

## 2021-12-07 ENCOUNTER — Ambulatory Visit: Payer: BC Managed Care – PPO | Admitting: Physician Assistant

## 2021-12-07 VITALS — BP 116/91 | HR 90 | Temp 97.9°F | Ht 65.0 in | Wt 148.0 lb

## 2021-12-07 DIAGNOSIS — J3089 Other allergic rhinitis: Secondary | ICD-10-CM

## 2021-12-07 DIAGNOSIS — J309 Allergic rhinitis, unspecified: Secondary | ICD-10-CM

## 2021-12-07 MED ORDER — METHYLPREDNISOLONE SODIUM SUCC 125 MG IJ SOLR
125.0000 mg | Freq: Once | INTRAMUSCULAR | Status: AC
  Administered 2021-12-07: 125 mg via INTRAMUSCULAR

## 2021-12-07 MED ORDER — METHYLPREDNISOLONE 4 MG PO TBPK: ORAL_TABLET | ORAL | 0 refills | Status: DC

## 2021-12-07 NOTE — Patient Instructions (Signed)
Continue singulair Add allegra D Medrol dose pak to finish

## 2021-12-08 ENCOUNTER — Encounter: Payer: Self-pay | Admitting: Physician Assistant

## 2021-12-08 NOTE — Progress Notes (Signed)
Acute Office Visit  Subjective:     Patient ID: [name], female    DOB: 07-Aug-1975, 46 y.o.   MRN: [id number]  Chief Complaint  Patient presents with   Allergies    HPI Patient is in today for worsening allergies. She has known fall allergies. She feels like they are worse this year. She is having symptoms for weeks now. She is taking singulair daily but recently she has been sneezing, congested, itchy and watery eyes and sinus pressure. She has been taking sudafed and aftrin over the counter for a few days with some relief. No fever, chills, body aches, or SOB.   Marland Kitchen. 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     Objective:    BP (!) 116/91   Pulse 90   Temp 97.9 F (36.6 C) (Oral)   Ht 5\' 5"  (1.651 m)   Wt 148 lb (67.1 kg)   SpO2 100%   BMI 24.63 kg/m  BP Readings from Last 3 Encounters:  12/07/21 (!) 116/91  11/17/21 (!) 101/47  11/03/20 109/60   Wt Readings from Last 3 Encounters:  12/07/21 148 lb (67.1 kg)  11/17/21 148 lb (67.1 kg)  08/12/21 145 lb (65.8 kg)      Physical Exam Vitals reviewed.  Constitutional:      Appearance: Normal appearance.  HENT:     Head: Normocephalic.     Comments: Tenderness to maxillary sinuses to palpation    Right Ear: Tympanic membrane, ear canal and external ear normal. There is no impacted cerumen.     Left Ear: Tympanic membrane,  ear canal and external ear normal. There is no impacted cerumen.     Nose: Congestion present.     Mouth/Throat:     Mouth: Mucous membranes are moist.  Eyes:     General:        Right eye: Discharge present.        Left eye: Discharge present.    Extraocular Movements: Extraocular movements intact.     Pupils: Pupils are equal, round, and reactive to light.     Comments: Injected bilateral conjunctiva with watery discharge  Cardiovascular:     Rate and Rhythm: Normal rate and regular rhythm.  Pulmonary:     Effort: Pulmonary effort is normal.     Breath sounds: Normal breath sounds. No wheezing or rhonchi.  Musculoskeletal:     Right lower leg: No edema.     Left lower leg: No edema.  Neurological:     General: No focal deficit present.     Mental Status: She is alert.  Psychiatric:        Mood and Affect: Mood normal.           Assessment & Plan:  07/11/23Marland KitchenApril was seen today for allergies.  Diagnoses and all orders  for this visit:  Allergic sinusitis -     methylPREDNISolone (MEDROL DOSEPAK) 4 MG TBPK tablet; Take as directed by package insert. -     methylPREDNISolone sodium succinate (SOLU-MEDROL) 125 mg/2 mL injection 125 mg  Seasonal allergic rhinitis due to other allergic trigger   Shot of solumedrol given today Start medrol dose pack tomorrow No sign of bacterial sinusitis today Continue on singulair and add Allegra D during fall season Follow up as needed or if symptoms persist or signs of infection present   Tandy Gaw, PA-C

## 2021-12-13 ENCOUNTER — Encounter: Payer: Self-pay | Admitting: Physician Assistant

## 2021-12-13 DIAGNOSIS — J309 Allergic rhinitis, unspecified: Secondary | ICD-10-CM

## 2021-12-13 DIAGNOSIS — H6982 Other specified disorders of Eustachian tube, left ear: Secondary | ICD-10-CM | POA: Insufficient documentation

## 2021-12-29 ENCOUNTER — Telehealth: Payer: BC Managed Care – PPO | Admitting: Physician Assistant

## 2021-12-29 DIAGNOSIS — J020 Streptococcal pharyngitis: Secondary | ICD-10-CM

## 2021-12-29 MED ORDER — AZITHROMYCIN 250 MG PO TABS: ORAL_TABLET | ORAL | 0 refills | Status: AC

## 2021-12-29 MED ORDER — PREDNISONE 20 MG PO TABS: 40.0000 mg | ORAL_TABLET | Freq: Every day | ORAL | 0 refills | Status: DC

## 2021-12-29 NOTE — Progress Notes (Signed)
Virtual Visit Consent   [name], you are scheduled for a virtual visit with a [facility] provider today. Just as with appointments in the office, your consent must be obtained to participate. Your consent will be active for this visit and any virtual visit you may have with one of our providers in the next 365 days. If you have a MyChart account, a copy of this consent can be sent to you electronically.  As this is a virtual visit, video technology does not allow for your provider to perform a traditional examination. This may limit your provider's ability to fully assess your condition. If your provider identifies any concerns that need to be evaluated in person or the need to arrange testing (such as labs, EKG, etc.), we will make arrangements to do so. Although advances in technology are sophisticated, we cannot ensure that it will always work on either your end or our end. If the connection with a video visit is poor, the visit may have to be switched to a telephone visit. With either a video or telephone visit, we are not always able to ensure that we have a secure connection.  By engaging in this virtual visit, you consent to the provision of healthcare and authorize for your insurance to be billed (if applicable) for the services provided during this visit. Depending on your insurance coverage, you may receive a charge related to this service.  I need to obtain your verbal consent now. Are you willing to proceed with your visit today? [name] has provided verbal consent on 12/29/2021 for a virtual visit (video or telephone). [name], [place]  Date: 12/29/2021 10:06 AM  Virtual Visit via Video Note   I, [name], connected with  [name]  ([id number], [birth year]) on 12/29/21 at 10:00 AM EDT by a video-enabled telemedicine application and verified that I am speaking with the correct person using two identifiers.  Location: Patient: Virtual Visit Location Patient:  Work Provider: Engineer, mining Provider: Home Office   I discussed the limitations of evaluation and management by telemedicine and the availability of in person appointments. The patient expressed understanding and agreed to proceed.    History of Present Illness: [name] is a 46 y.o. who identifies as a female who was assigned female at birth, and is being seen today for recurring symptoms of strep throat despite still completing course of Amoxicillin. Was seen last week at outside [facility] for severe sore throat, testing positive for strep. Started on Amox and taking as directed with initial improvement in symptoms but seems to have worsened again with more substantial pain and slight increase in swelling. Denies dysphagia but some odynophagia. Denies fever, chills, aches. Denies difficulty breathing.    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   Medications:  Current Outpatient Medications:    azithromycin (ZITHROMAX) 250 MG tablet, Take  2 tablets on day 1, then 1 tablet daily on days 2 through 5, Disp: 6 tablet, Rfl: 0   predniSONE (DELTASONE) 20 MG tablet, Take 2 tablets (40 mg total) by mouth daily with breakfast., Disp: 10 tablet, Rfl: 0   fluticasone (FLONASE) 50 MCG/ACT nasal spray, Place into both nostrils daily., Disp: , Rfl:    montelukast (SINGULAIR) 10 MG tablet, Take 1 tablet (10 mg  total) by mouth at bedtime., Disp: 90 tablet, Rfl: 3   sertraline (ZOLOFT) 50 MG tablet, Take one and one-half tablet daily., Disp: 135 tablet, Rfl: 0   Suvorexant (BELSOMRA) 10 MG TABS, Take 1 tablet by mouth at bedtime., Disp: 30 tablet, Rfl: 1  Observations/Objective: Patient is well-developed, well-nourished in no acute distress.  Resting comfortably at home.  Head is normocephalic, atraumatic.  No labored breathing. Speech is clear and coherent with logical content.  Patient is alert and oriented at baseline.  Posterior oropharyngeal erythema and tonsillar swelling noted. No uvular swelling, lesion or displacement identified.  Do not appreciate exudate at present.   Assessment and Plan: 1. Strep pharyngitis - azithromycin (ZITHROMAX) 250 MG tablet; Take 2 tablets on day 1, then 1 tablet daily on days 2 through 5  Dispense: 6 tablet; Refill: 0 - predniSONE (DELTASONE) 20 MG tablet; Take 2 tablets (40 mg total) by mouth daily with breakfast.  Dispense: 10 tablet; Refill: 0  Treatment failure with Amox. Stop Amox. Will start Azithromycin. Supportive measures and OTC medications reviewed. Will give short burst of steroid for inflammation. Strict in-person follow- up precautions reviewed.   Follow Up Instructions: I discussed the assessment and treatment plan with the patient. The patient was provided an opportunity to ask questions and all were answered. The patient agreed with the plan and demonstrated an understanding of the instructions.  A copy of instructions were sent to the patient via MyChart unless otherwise noted below.   The patient was advised to call back or seek an in-person evaluation if the symptoms worsen or if the condition fails to improve as anticipated.  Time:  I spent 10 minutes with the patient via telehealth technology discussing the above problems/concerns.    Leeanne Rio, PA-C

## 2021-12-29 NOTE — Patient Instructions (Signed)
  Shawnell L Nish, thank you for joining Leeanne Rio, PA-C for today's virtual visit.  While this provider is not your primary care provider (PCP), if your PCP is located in our provider database this encounter information will be shared with them immediately following your visit.  Consent: (Patient) [name] provided verbal consent for this virtual visit at the beginning of the encounter.  Current Medications:  Current Outpatient Medications:    azithromycin (ZITHROMAX) 250 MG tablet, Take 2 tablets on day 1, then 1 tablet daily on days 2 through 5, Disp: 6 tablet, Rfl: 0   predniSONE (DELTASONE) 20 MG tablet, Take 2 tablets (40 mg total) by mouth daily with breakfast., Disp: 10 tablet, Rfl: 0   fluticasone (FLONASE) 50 MCG/ACT nasal spray, Place into both nostrils daily., Disp: , Rfl:    montelukast (SINGULAIR) 10 MG tablet, Take 1 tablet (10 mg total) by mouth at bedtime., Disp: 90 tablet, Rfl: 3   sertraline (ZOLOFT) 50 MG tablet, Take one and one-half tablet daily., Disp: 135 tablet, Rfl: 0   Suvorexant (BELSOMRA) 10 MG TABS, Take 1 tablet by mouth at bedtime., Disp: 30 tablet, Rfl: 1   Medications ordered in this encounter:  Meds ordered this encounter  Medications   azithromycin (ZITHROMAX) 250 MG tablet    Sig: Take 2 tablets on day 1, then 1 tablet daily on days 2 through 5    Dispense:  6 tablet    Refill:  0    Order Specific Question:   Supervising Provider    Answer:   Chase Picket [[id number]]   predniSONE (DELTASONE) 20 MG tablet    Sig: Take 2 tablets (40 mg total) by mouth daily with breakfast.    Dispense:  10 tablet    Refill:  0    Order Specific Question:   Supervising Provider    Answer:   Chase Picket [id number]     *If you need refills on other medications prior to your next appointment, please contact your pharmacy*  Follow-Up: Call back or seek an in-person evaluation if the symptoms worsen or if the condition fails to improve as  anticipated.  [place] [facility] [phone]  Other Instructions Please stop the Amoxicillin. Take the Azithromycin as directed. Keep hydrated and rest. Tylenol, salt-water gargles and chloraseptic spray. Take the steroid as directed.  If not resolving or noting any new/worsening symptoms despite treatment, please seek an in-person evaluation ASAP.   If you have been instructed to have an in-person evaluation today at a local Urgent Care facility, please use the link below. It will take you to a list of all of our available [facility], including address, phone number and hours of operation. Please do not delay care.  [facility]  If you or a family member do not have a primary care provider, use the link below to schedule a visit and establish care. When you choose a [facility] primary care physician or advanced practice provider, you gain a long-term partner in health. Find a Primary Care Provider  Learn more about [facility]'s in-office and virtual care options: [facility]

## 2022-02-02 ENCOUNTER — Other Ambulatory Visit: Payer: Self-pay | Admitting: Physician Assistant

## 2022-02-28 ENCOUNTER — Encounter: Payer: Self-pay | Admitting: Physician Assistant

## 2022-02-28 ENCOUNTER — Telehealth (INDEPENDENT_AMBULATORY_CARE_PROVIDER_SITE_OTHER): Payer: BC Managed Care – PPO | Admitting: Physician Assistant

## 2022-02-28 DIAGNOSIS — J0100 Acute maxillary sinusitis, unspecified: Secondary | ICD-10-CM | POA: Diagnosis not present

## 2022-02-28 MED ORDER — PREDNISONE 20 MG PO TABS: 40.0000 mg | ORAL_TABLET | Freq: Every day | ORAL | 0 refills | Status: DC

## 2022-02-28 NOTE — Progress Notes (Signed)
..  Virtual Visit via Video Note  I connected with [name] on 02/28/22 at  1:40 PM EST by a video enabled telemedicine application and verified that I am speaking with the correct person using two identifiers.  Location: Patient: home Provider: clinic  .Marland KitchenParticipating in visit:  [name] Provider:Duan Scharnhorst PA-C   I discussed the limitations of evaluation and management by telemedicine and the availability of in person appointments. The patient expressed understanding and agreed to proceed.  History of Present Illness: Pt is a 46 yo female who is having sinus pressure, nasal congestion pain and pressure for the last 10 days. She is blowing her nose but sputum is clear. No fever, chills, body aches. She is taking sudafed, flonase, singulair, and claritin D with little benefit. Afrin worked well but then congestion would come back worse. No other sick contacts.   .. 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 Normal breathing  .Marland KitchenThere were no vitals filed for this visit. There is no height or weight on file to calculate BMI.    Assessment and Plan: Marland KitchenMarland KitchenApril was seen today for sinus problem.  Diagnoses and all orders for this  visit:  Acute non-recurrent maxillary sinusitis -     predniSONE (DELTASONE) 20 MG tablet; Take 2 tablets (40 mg total) by mouth daily with breakfast.   Sinusitis appears more viral than bacterial Prednisone for 5 days Continue flonase, singulair, claritin D daily. If not improving will consider abx Stay hydrated.    Follow Up Instructions:    I discussed the assessment and treatment plan with the patient. The patient was provided an opportunity to ask questions and all were answered. The patient agreed with the plan and demonstrated an understanding of the instructions.   The patient was advised to call back or seek an in-person evaluation if the symptoms worsen or if the condition fails to improve as anticipated.   Tandy Gaw, PA-C

## 2022-03-14 ENCOUNTER — Encounter: Payer: Self-pay | Admitting: Physician Assistant

## 2022-03-16 ENCOUNTER — Telehealth (INDEPENDENT_AMBULATORY_CARE_PROVIDER_SITE_OTHER): Payer: BC Managed Care – PPO | Admitting: Physician Assistant

## 2022-03-16 DIAGNOSIS — J40 Bronchitis, not specified as acute or chronic: Secondary | ICD-10-CM | POA: Diagnosis not present

## 2022-03-16 DIAGNOSIS — J329 Chronic sinusitis, unspecified: Secondary | ICD-10-CM

## 2022-03-16 DIAGNOSIS — J040 Acute laryngitis: Secondary | ICD-10-CM | POA: Diagnosis not present

## 2022-03-16 MED ORDER — HYDROCOD POLI-CHLORPHE POLI ER 10-8 MG/5ML PO SUER: 5.0000 mL | Freq: Two times a day (BID) | ORAL | 0 refills | Status: DC | PRN

## 2022-03-16 MED ORDER — AMOXICILLIN-POT CLAVULANATE 875-125 MG PO TABS: 1.0000 | ORAL_TABLET | Freq: Two times a day (BID) | ORAL | 0 refills | Status: DC

## 2022-03-16 NOTE — Progress Notes (Signed)
..  Virtual Visit via Video Note  I connected with [name] on 03/16/22 at 11:30 AM EST by a video enabled telemedicine application and verified that I am speaking with the correct person using two identifiers.  Location: Patient: work Provider: clinic  .Marland KitchenParticipating in visit:  [name] Provider: Tandy Gaw PA-C   I discussed the limitations of evaluation and management by telemedicine and the availability of in person appointments. The patient expressed understanding and agreed to proceed.  History of Present Illness: Pt is a 46 yo female with laryngitis, cough, sinus pressure who is not getting better. She has been seen by UC on 03/10/2022 and given prednisone. She is on her last dose. She is still coughing to the point she cannot sleep. Her voice is still not back. No fever, chills, body aches. She is taking OTC delsym and mucinex with very minimal relief.    .. 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  Diagnoses and all orders for this visit:  Sinobronchitis -     amoxicillin-clavulanate (AUGMENTIN) 875-125 MG tablet; Take 1 tablet by mouth 2 (two) times  daily. -     chlorpheniramine-HYDROcodone (TUSSIONEX) 10-8 MG/5ML; Take 5 mLs by mouth every 12 (twelve) hours as needed for cough (cough, will cause drowsiness.).  Laryngitis -     amoxicillin-clavulanate (AUGMENTIN) 875-125 MG tablet; Take 1 tablet by mouth 2 (two) times daily. -     chlorpheniramine-HYDROcodone (TUSSIONEX) 10-8 MG/5ML; Take 5 mLs by mouth every 12 (twelve) hours as needed for cough (cough, will cause drowsiness.).  Pt just finished prednisone, not going to add any more at this point Start augmentin for secondary bacterial infection.  Tussionex given for cough at bedtime, sedation warning given. Follow up as needed.    Follow Up Instructions:    I discussed the assessment and treatment plan with the patient. The patient was provided an opportunity to ask questions and all were answered. The patient agreed with the plan and demonstrated an understanding of the instructions.   The patient was advised to call back or seek an in-person evaluation if the symptoms worsen or if the condition fails to improve as anticipated.    Tandy Gaw, PA-C

## 2022-03-16 NOTE — Patient Instructions (Signed)
Laryngitis  Laryngitis is inflammation of the vocal cords that causes symptoms such as hoarseness or loss of voice. The vocal cords are two bands of muscles in your throat. When you speak, these cords come together and vibrate. The vibrations come out through your mouth as sound. When your vocal cords are inflamed, your voice sounds different. Laryngitis can be temporary (acute) or long-term (chronic). Most cases of acute laryngitis improve with time. Chronic laryngitis is laryngitis that lasts for more than 3 weeks. What are the causes? Acute laryngitis may be caused by: A viral infection. Lots of talking, yelling, or singing. This is also called vocal strain. A bacterial infection. Chronic laryngitis may be caused by: Vocal strain or an injury to the vocal cords. Acid reflux (gastroesophageal reflux disease, or GERD). Allergies, a sinus infection, or postnasal drip. Smoking. Excessive alcohol use. Breathing in chemicals or dust. Growths on the vocal cords. What increases the risk? The following factors may make you more likely to develop this condition: Smoking. Alcohol abuse. Having allergies. Chronic irritants in the workplace, such as toxic fumes. What are the signs or symptoms? Symptoms of this condition may include: Low, hoarse voice. Loss of voice. Dry cough. Sore or dry throat. Stuffy or congested nose. How is this diagnosed? This condition may be diagnosed based on: Your symptoms and a physical exam. Throat culture. Blood test. A procedure in which your health care provider looks at your vocal cords with a mirror or viewing tube (laryngoscopy). How is this treated? Treatment for laryngitis depends on what is causing it. Usually, treatment involves resting your voice and using medicines to soothe your throat. If your laryngitis is caused by a bacterial infection, you may need to take antibiotic medicine. If your laryngitis is caused by a growth, you may need to have a  procedure to remove it. Follow these instructions at home: Medicines Take over-the-counter and prescription medicines only as told by your health care provider. If you were prescribed an antibiotic medicine, take it as told by your health care provider. Do not stop taking the antibiotic even if you start to feel better. Use throat lozenges or sprays to soothe your throat as told by your health care provider. General instructions  Talk as little as possible. To do this: Write instead of talking. Do this until your voice is back to normal. Avoid whispering, which can cause vocal strain. Gargle with a mixture of salt and water 3-4 times a day or as needed. To make salt water, completely dissolve -1 tsp (3-6 g) of salt in 1 cup (237 mL) of warm water. Drink enough fluid to keep your urine pale yellow. Breathe in moist air. Use a humidifier if you live in a dry climate. Do not use any products that contain nicotine or tobacco. These products include cigarettes, chewing tobacco, and vaping devices, such as e-cigarettes. If you need help quitting, ask your health care provider. Contact a health care provider if: You have a fever. You have increasing pain. Your symptoms do not get better in 2 weeks. Get help right away if: You cough up blood. You have difficulty swallowing. You have trouble breathing. Summary Laryngitis is inflammation of the vocal cords that causes symptoms such as hoarseness or loss of voice. Laryngitis can be temporary or long-term. Treatment for laryngitis depends on the cause. It often involves resting your voice and using medicine to soothe your throat. Get help right away if you have difficulty swallowing or breathing or if you cough   up blood. This information is not intended to replace advice given to you by your health care provider. Make sure you discuss any questions you have with your health care provider. Document Revised: 06/08/2020 Document Reviewed:  06/08/2020 Elsevier Patient Education  2023 Elsevier Inc.  

## 2022-03-16 NOTE — Progress Notes (Signed)
Seen at Urgent Care 03/10/2022 for laryngitis, sore throat, cough Placed on prednisone No better

## 2022-03-18 ENCOUNTER — Encounter: Payer: Self-pay | Admitting: Physician Assistant

## 2022-03-19 ENCOUNTER — Other Ambulatory Visit: Payer: Self-pay | Admitting: Physician Assistant

## 2022-03-19 DIAGNOSIS — F325 Major depressive disorder, single episode, in full remission: Secondary | ICD-10-CM

## 2022-03-19 DIAGNOSIS — F429 Obsessive-compulsive disorder, unspecified: Secondary | ICD-10-CM

## 2022-03-19 DIAGNOSIS — F411 Generalized anxiety disorder: Secondary | ICD-10-CM

## 2022-03-23 ENCOUNTER — Telehealth: Payer: Self-pay | Admitting: Family Medicine

## 2022-03-23 NOTE — Telephone Encounter (Signed)
Pls call pt to find out where had last pap. Lat one on file is 2017

## 2022-03-23 NOTE — Telephone Encounter (Signed)
Was unable to reach patient by phone. Sent a Mychart message requesting information regarding most recent pap.

## 2022-03-30 ENCOUNTER — Encounter: Payer: Self-pay | Admitting: Physician Assistant

## 2022-03-30 NOTE — Progress Notes (Signed)
Negative for intraepithelial lesion or malignancy.  

## 2022-04-05 ENCOUNTER — Encounter: Payer: Self-pay | Admitting: Physician Assistant

## 2022-04-13 ENCOUNTER — Encounter: Payer: Self-pay | Admitting: Physician Assistant

## 2022-04-13 ENCOUNTER — Ambulatory Visit: Payer: BC Managed Care – PPO | Admitting: Physician Assistant

## 2022-04-13 VITALS — BP 118/64 | HR 80 | Ht 65.0 in | Wt 152.0 lb

## 2022-04-13 DIAGNOSIS — T783XXA Angioneurotic edema, initial encounter: Secondary | ICD-10-CM

## 2022-04-13 DIAGNOSIS — L299 Pruritus, unspecified: Secondary | ICD-10-CM

## 2022-04-13 MED ORDER — EPINEPHRINE 0.3 MG/0.3ML IJ SOAJ: 0.3000 mg | INTRAMUSCULAR | 1 refills | Status: DC | PRN

## 2022-04-13 MED ORDER — HYDROXYZINE PAMOATE 25 MG PO CAPS: ORAL_CAPSULE | ORAL | 0 refills | Status: DC

## 2022-04-13 NOTE — Progress Notes (Signed)
Acute Office Visit  Subjective:     Patient ID: [name], female    DOB: April 14, 1975, 47 y.o.   MRN: [id number]  Chief Complaint  Patient presents with   Oral Swelling    HPI Patient is in today for lower lip swelling in the past few weeks after eating. She has never had anything like this before. She feels burning and tingling right when she eats something that triggers it and then swelling and itching or lower lip in the next 10 minutes. After benadryl still takes 30 minutes to completely resolve. No tongue swelling, difficultly swallowing or shortness of breath. Last time happened was last Friday with lasagna. It has also happened after cool ranch doritos and cinnamon toast crunch. It has happened at least 3 times.   .. 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     Objective:    BP 118/64   Pulse 80   Ht 5\' 5"  (1.651 m)   Wt 152 lb (68.9 kg)   SpO2 100%   BMI 25.29 kg/m  BP Readings from Last 3 Encounters:  04/13/22 118/64  12/07/21 (!) 116/91  11/17/21 (!) 101/47   Wt Readings from Last 3 Encounters:  04/13/22 152 lb (68.9 kg)  12/07/21 148 lb (67.1 kg)  11/17/21 148 lb (67.1 kg)      Physical Exam Vitals reviewed.  Constitutional:      Appearance: Normal appearance.   HENT:     Head: Normocephalic.     Nose: Nose normal.     Mouth/Throat:     Mouth: Mucous membranes are moist.     Pharynx: No oropharyngeal exudate or posterior oropharyngeal erythema.  Eyes:     Conjunctiva/sclera: Conjunctivae normal.  Neck:     Vascular: No carotid bruit.  Cardiovascular:     Rate and Rhythm: Normal rate and regular rhythm.     Pulses: Normal pulses.  Pulmonary:     Effort: Pulmonary effort is normal.     Breath sounds: Normal breath sounds.  Musculoskeletal:     Cervical back: Normal range of motion and neck supple. No rigidity or tenderness.  Lymphadenopathy:     Cervical: No cervical adenopathy.  Skin:    Findings: No rash.  Neurological:     General: No focal deficit present.     Mental Status: She is alert.  Psychiatric:        Mood and Affect: Mood normal.          Assessment & Plan:  Marland KitchenMarland KitchenApril was seen today for oral swelling.  Diagnoses and all orders for this visit:  Angioedema, initial encounter -     Allergen food profile specific IgE -     EPINEPHrine (EPIPEN 2-PAK)  0.3 mg/0.3 mL IJ SOAJ injection; Inject 0.3 mg into the muscle as needed for anaphylaxis. -     CBC w/Diff/Platelet -     TSH -     Sed Rate (ESR) -     hydrOXYzine (VISTARIL) 25 MG capsule; Take one to two tablets as needed for allergic reaction/swelling/itching. -     Comprehensive metabolic panel -     Ambulatory referral to Allergy  Itching -     Allergen food profile specific IgE -     CBC w/Diff/Platelet -     TSH -     Sed Rate (ESR) -     hydrOXYzine (VISTARIL) 25 MG capsule; Take one to two tablets as needed for allergic reaction/swelling/itching. -     Comprehensive metabolic panel -     Ambulatory referral to Allergy   IgE food testing ordered today with other baseline labs Epipen given to use as needed if swelling worsens or has any breathing difficulty Ok to use benadryl or hydroxyzine for itching/swelling. Keep food diary if happens again Will  refer to allergy   Iran Planas, PA-C

## 2022-04-13 NOTE — Patient Instructions (Addendum)
[facility] is located at [place] in [place] [phone]  Angioedema Angioedema is the sudden swelling of tissue in the body. Angioedema can affect any part of the body, including the legs, hands, genitals, face, mouth, lips, and internal organs, like your intestines. Depending on the cause, angioedema may happen just once. However, some people may have repeated bouts of angioedema during their lives. Symptoms may be mild and may occur along with other allergic symptoms such as itchy, red, swollen areas of skin (hives). Severe angioedema can be life-threatening if it affects the air passages and blocks breathing. What are the causes? This condition may be caused by: Foods, such as milk, eggs, shellfish, wheat, or nuts. Certain medicines, such as ACE inhibitors, birth control pills, dyes used in X-rays, or NSAIDs, such as ibuprofen. Hereditary angioedema (HAE) is genetic. Episodes can be triggered by: Illness, infection, or emotional or physical stress. Changes in hormone levels. Exercise. Minor surgical or dental procedures. In some cases, the cause of this condition is not known. What increases the risk? You are more likely to develop HAE if you have family members with this condition.  What are the signs or symptoms?  Symptoms of this condition depend on where the swelling happens.  Symptoms of this condition include: Swollen skin. Hives. Pain, pressure, or tenderness in the affected area. Swollen eyelids, face, lips, or tongue. Trouble drinking, swallowing, or closing the mouth completely. Hoarseness or sore throat. Wheezing or trouble breathing. If your internal organs are affected, symptoms may also include: Nausea. Pain in the abdomen. Vomiting or diarrhea. Trouble swallowing. Trouble passing urine. How is this diagnosed? This condition may be diagnosed based on: An exam of the affected area. Your medical history. Whether anyone in your family has had  this condition before. A review of any medicines you have been taking. Tests, including: Allergy skin tests to see if the condition was caused by an allergic reaction. Blood tests to see if the condition was caused by certain inherited or genetic diseases. How is this treated? Treatment for this condition depends on the cause and severity of your symptoms. It may involve any of the following: Avoiding triggers, if they are known. Triggers may include foods or environmental allergens. Stopping medicines permanently if they cause the condition. These include ACE inhibitors. Taking medicines to treat symptoms or prevent future episodes. These may include: Antihistamines. Epinephrine injections. Steroids. Blood products to treat specific types of non-allergic angioedema. Breathing tubes or ventilators in severe cases in which breathing is affected. Severe cases of angioedema are treated at the hospital. Mild to moderate angioedema usually gets better in 24-48 hours. Follow these instructions at home:  Take over-the-counter and prescription medicines only as told by your health care provider. If you were given medicines for emergency allergy treatment, always carry them with you. This includes epinephrine injector kits. Wear a medical bracelet as told by your health care provider. If something triggers your condition, avoid the trigger. Triggers can be foods, environmental allergens, stress, or exercise. Avoid all medicines that caused your angioedema. This is for your entire life. If your condition is inherited and you are thinking about having children, talk to your health care provider. It is important to discuss the risks of passing on the condition to your children. Where to find more information American Academy of Allergy Asthma & Immunology: www.aaaai.org Contact a health care provider if: You continue to have repeated episodes of angioedema. Episodes of angioedema start to happen  more  often than they used to, even after you take steps to prevent them. You have episodes of angioedema that are more severe than they have been before, even after you take steps to prevent them. You are thinking about having children. Get help right away if: You have severe swelling of your mouth, tongue, or lips. Your swelling gets worse. You have trouble breathing, swallowing, or talking. You have chest pain, dizziness or light-headedness, or you pass out. These symptoms may represent a serious problem that is an emergency. Do not wait to see if the symptoms will go away. Get medical help right away. Call your local emergency services (911 in the [place] Do not drive yourself to the hospital. Summary Angioedema is the sudden swelling of tissues. It is important to be aware of all triggers or causes for your angioedema and to avoid them. Treatment for this condition depends on the cause and severity of your symptoms. Severe angioedema can be life-threatening if it blocks the air passages. This information is not intended to replace advice given to you by your health care provider. Make sure you discuss any questions you have with your health care provider. Document Revised: 07/22/2020 Document Reviewed: 07/22/2020 Elsevier Patient Education  [place].

## 2022-04-20 ENCOUNTER — Other Ambulatory Visit: Payer: Self-pay | Admitting: Physician Assistant

## 2022-04-20 DIAGNOSIS — T783XXA Angioneurotic edema, initial encounter: Secondary | ICD-10-CM

## 2022-04-20 DIAGNOSIS — L299 Pruritus, unspecified: Secondary | ICD-10-CM

## 2022-05-02 NOTE — Progress Notes (Unsigned)
New Patient Note  RE: [name] MRN: [id number] DOB: 29-Apr-1975 Date of Office Visit: 05/03/2022  Consult requested by: Donella Stade, PA-C Primary care provider: Donella Stade, PA-C  Chief Complaint: No chief complaint on file.  History of Present Illness: I had the pleasure of seeing [name] for initial evaluation at the [facility] on 05/02/2022. She is a 47 y.o. female, who is referred here by Donella Stade, PA-C for the evaluation of angioedema.  Swelling started about *** ago. Mainly occurs on her ***. Describes them as ***. Individual swelling episodes last about ***. No ecchymosis upon resolution. Associated symptoms include: ***.  Frequency of episodes: ***. Suspected triggers are ***. Denies any *** fevers, chills, changes in medications, foods, personal care products or recent infections. She has tried the following therapies: *** with *** benefit. Systemic steroids ***. Currently on ***.  Previous work up includes: ***. Previous history of swelling: {Blank single:19197::"yes","no"}. Family history of angioedema: {Blank single:19197::"yes","no"}. Patient is up to date with the following cancer screening tests: ***. Ace-inhibitor use: {Blank single:19197::"yes","no"}  04/13/2022 PCP visit: "Patient is in today for lower lip swelling in the past few weeks after eating. She has never had anything like this before. She feels burning and tingling right when she eats something that triggers it and then swelling and itching or lower lip in the next 10 minutes. After benadryl still takes 30 minutes to completely resolve. No tongue swelling, difficultly swallowing or shortness of breath. Last time happened was last Friday with lasagna. It has also happened after cool ranch doritos and cinnamon toast crunch. It has happened at least 3 times. "  Assessment and Plan: [name] is a 47 y.o. female with: No problem-specific Assessment & Plan notes found for this  encounter.  No follow-ups on file.  No orders of the defined types were placed in this encounter.  Lab Orders  No laboratory test(s) ordered today    Other allergy screening: Asthma: {Blank single:19197::"yes","no"} Rhino conjunctivitis: {Blank single:19197::"yes","no"} Food allergy: {Blank single:19197::"yes","no"} Medication allergy: {Blank single:19197::"yes","no"} Hymenoptera allergy: {Blank single:19197::"yes","no"} Urticaria: {Blank single:19197::"yes","no"} Eczema:{Blank single:19197::"yes","no"} History of recurrent infections suggestive of immunodeficency: {Blank single:19197::"yes","no"}  Diagnostics: Spirometry:  Tracings reviewed. Her effort: {Blank single:19197::"Good reproducible efforts.","It was hard to get consistent efforts and there is a question as to whether this reflects a maximal maneuver.","Poor effort, data can not be interpreted."} FVC: ***L FEV1: ***L, ***% predicted FEV1/FVC ratio: ***% Interpretation: {Blank single:19197::"Spirometry consistent with mild obstructive disease","Spirometry consistent with moderate obstructive disease","Spirometry consistent with severe obstructive disease","Spirometry consistent with possible restrictive disease","Spirometry consistent with mixed obstructive and restrictive disease","Spirometry uninterpretable due to technique","Spirometry consistent with normal pattern","No overt abnormalities noted given today's efforts"}.  Please see scanned spirometry results for details.  Skin Testing: {Blank single:19197::"Select foods","Environmental allergy panel","Environmental allergy panel and select foods","Food allergy panel","None","Deferred due to recent antihistamines use"}. *** Results discussed with patient/family.   Past Medical History: Patient Active Problem List   Diagnosis Date Noted  . ETD (Eustachian tube dysfunction), left 12/13/2021  . Cold intolerance 11/03/2020  . Panic attack 11/27/2019  . 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 Depression 12/11/2017  . Menorrhagia 05/18/2015  . History of HPV infection 03/06/2015  . Cervical dysplasia 03/06/2015  . Trouble in sleeping 03/06/2015  . GAD (generalized anxiety disorder) 02/03/2015  . OCD (obsessive compulsive disorder) 02/03/2015  . Chest pain 10/16/2013  . Hyperlipidemia 10/16/2013   Past Medical History:  Diagnosis Date  . Abnormal Pap smear   .  Depression    pp  . Frequent UTI   . Hx of varicella   . Hyperlipidemia   . Kidney stones    Past Surgical History: Past Surgical History:  Procedure Laterality Date  . DILATION AND CURETTAGE OF UTERUS    . HYSTERECTOMY ABDOMINAL WITH SALPINGECTOMY    . KIDNEY STONE SURGERY    . LEEP     Medication List:  Current Outpatient Medications  Medication Sig Dispense Refill  . EPINEPHrine (EPIPEN 2-PAK) 0.3 mg/0.3 mL IJ SOAJ injection Inject 0.3 mg into the muscle as needed for anaphylaxis. 1 each 1  . hydrOXYzine (VISTARIL) 25 MG capsule TAKE ONE TO TWO TABLETS AS NEEDED FOR ALLERGIC REACTION/SWELLING/ITCHING. 180 capsule 1  . montelukast (SINGULAIR) 10 MG tablet TAKE 1 TABLET BY MOUTH EVERYDAY AT BEDTIME 90 tablet 3  . sertraline (ZOLOFT) 50 MG tablet TAKE ONE AND ONE-HALF TABLET DAILY. 135 tablet 0   No current facility-administered medications for this visit.   Allergies: Allergies  Allergen Reactions  . Trazodone And Nefazodone     Bad nightmares and feeling groggy.   Social History: Social History   Socioeconomic History  . Marital status: Married    Spouse name: Not on file  . Number of children: 3  . Years of education: Not on file  . Highest education level: Not on file  Occupational History    Employer: OTHER  Tobacco Use  . Smoking status: Former  . Smokeless tobacco: Never  Substance and Sexual Activity  . Alcohol use: Yes    Comment: Occasional  . Drug use: No  . Sexual activity: Yes    Partners: Male    Birth control/protection: Other-see comments     Comment: Hysterectomy  Other Topics Concern  . Not on file  Social History Narrative  . Not on file   Social Determinants of Health   Financial Resource Strain: Not on file  Food Insecurity: Not on file  Transportation Needs: Not on file  Physical Activity: Not on file  Stress: Not on file  Social Connections: Not on file   Lives in a ***. Smoking: *** Occupation: ***  Environmental HistorySurveyor, minerals in the house: Copywriter, advertising in the family room: {Blank single:19197::"yes","no"} Carpet in the bedroom: {Blank single:19197::"yes","no"} Heating: {Blank single:19197::"electric","gas","heat pump"} Cooling: {Blank single:19197::"central","window","heat pump"} Pet: {Blank single:19197::"yes ***","no"}  Family History: Family History  Problem Relation Age of Onset  . Diabetes Father   . CAD Father        Died of MI at age 72  . Cancer Paternal Aunt        pancreatic  . Alcohol abuse Maternal Grandfather   . Anemia Mother   . Cancer Mother        bone  . Hyperlipidemia Mother   . Anemia Brother   . Hypertension Maternal Grandmother   . Breast cancer Neg Hx    Problem                               Relation Asthma                                   *** Eczema                                *** Food allergy                          ***  Allergic rhino conjunctivitis     ***  Review of Systems  Constitutional:  Negative for appetite change, chills, fever and unexpected weight change.  HENT:  Negative for congestion and rhinorrhea.   Eyes:  Negative for itching.  Respiratory:  Negative for cough, chest tightness, shortness of breath and wheezing.   Cardiovascular:  Negative for chest pain.  Gastrointestinal:  Negative for abdominal pain.  Genitourinary:  Negative for difficulty urinating.  Skin:  Negative for rash.  Neurological:  Negative for headaches.   Objective: There were no vitals taken for this visit. There is no height or  weight on file to calculate BMI. Physical Exam Vitals and nursing note reviewed.  Constitutional:      Appearance: Normal appearance. She is well-developed.  HENT:     Head: Normocephalic and atraumatic.     Right Ear: Tympanic membrane and external ear normal.     Left Ear: Tympanic membrane and external ear normal.     Nose: Nose normal.     Mouth/Throat:     Mouth: Mucous membranes are moist.     Pharynx: Oropharynx is clear.  Eyes:     Conjunctiva/sclera: Conjunctivae normal.  Cardiovascular:     Rate and Rhythm: Normal rate and regular rhythm.     Heart sounds: Normal heart sounds. No murmur heard.    No friction rub. No gallop.  Pulmonary:     Effort: Pulmonary effort is normal.     Breath sounds: Normal breath sounds. No wheezing, rhonchi or rales.  Musculoskeletal:     Cervical back: Neck supple.  Skin:    General: Skin is warm.     Findings: No rash.  Neurological:     Mental Status: She is alert and oriented to person, place, and time.  Psychiatric:        Behavior: Behavior normal.  The plan was reviewed with the patient/family, and all questions/concerned were addressed.  It was my pleasure to see Ivory today and participate in her care. Please feel Justice to contact me with any questions or concerns.  Sincerely,  Rexene Alberts, DO Allergy & Immunology  [facility] office: [place] office: [phone]

## 2022-05-03 ENCOUNTER — Ambulatory Visit: Payer: BC Managed Care – PPO | Admitting: Allergy

## 2022-05-03 ENCOUNTER — Encounter: Payer: Self-pay | Admitting: Allergy

## 2022-05-03 VITALS — BP 102/60 | HR 54 | Temp 98.3°F | Resp 18 | Ht 65.25 in | Wt 155.0 lb

## 2022-05-03 DIAGNOSIS — T781XXD Other adverse food reactions, not elsewhere classified, subsequent encounter: Secondary | ICD-10-CM | POA: Insufficient documentation

## 2022-05-03 DIAGNOSIS — T783XXD Angioneurotic edema, subsequent encounter: Secondary | ICD-10-CM

## 2022-05-03 DIAGNOSIS — J3089 Other allergic rhinitis: Secondary | ICD-10-CM | POA: Diagnosis not present

## 2022-05-03 DIAGNOSIS — T781XXA Other adverse food reactions, not elsewhere classified, initial encounter: Secondary | ICD-10-CM

## 2022-05-03 DIAGNOSIS — T783XXA Angioneurotic edema, initial encounter: Secondary | ICD-10-CM | POA: Insufficient documentation

## 2022-05-03 DIAGNOSIS — H1010 Acute atopic conjunctivitis, unspecified eye: Secondary | ICD-10-CM | POA: Insufficient documentation

## 2022-05-03 DIAGNOSIS — L246 Irritant contact dermatitis due to food in contact with skin: Secondary | ICD-10-CM | POA: Insufficient documentation

## 2022-05-03 NOTE — Assessment & Plan Note (Signed)
>>  ASSESSMENT AND PLAN FOR OTHER ADVERSE FOOD REACTIONS, NOT ELSEWHERE CLASSIFIED, SUBSEQUENT ENCOUNTER WRITTEN ON 05/03/2022  9:39 PM BY Jaber Dunlow, Jamesetta So, DO  4 episodes of tingling in her gums with itchy lower face, lower lip swelling and bumps inside her mouth. No respiratory compromise. Occurred after eating doritos, cinnamon toast crunch cereal, lasagna and cinnamon coffee creamer which are all foods she had before with no issues. Symptoms resolve within 1 hour. Denies any other changes in diet, meds or personal care products. Concerned about food allergies. Today's skin testing showed: Negative to select foods.  Keep track of episodes and take pictures. If this happens again, write down what you had eaten and send me a mychart message.  Wash mouth out with plain water.  Possibly had irritation response to these foods. I don't have skin testing available for some the preservatives and other ingredients found in these foods. If this is becoming more frequent then will get bloodwork next. Avoid irritating foods such as spicy foods, acidic foods, carbonated beverages.  Tomatoes, cinnamon are irritating to the gum.  For mild symptoms you can take over the counter antihistamines such as Benadryl 25mg -50mg  and monitor symptoms closely. If symptoms worsen or if you have severe symptoms including breathing issues, throat closure, significant swelling, whole body hives, severe diarrhea and vomiting, lightheadedness then seek immediate medical care.

## 2022-05-03 NOTE — Assessment & Plan Note (Signed)
4 episodes of tingling in her gums with itchy lower face, lower lip swelling and bumps inside her mouth. No respiratory compromise. Occurred after eating doritos, cinnamon toast crunch cereal, lasagna and cinnamon coffee creamer which are all foods she had before with no issues. Symptoms resolve within 1 hour. Denies any other changes in diet, meds or personal care products. Concerned about food allergies. Today's skin testing showed: Negative to select foods.  Keep track of episodes and take pictures. If this happens again, write down what you had eaten and send me a mychart message.  Wash mouth out with plain water.  Possibly had irritation response to these foods. I don't have skin testing available for some the preservatives and other ingredients found in these foods. If this is becoming more frequent then will get bloodwork next. Avoid irritating foods such as spicy foods, acidic foods, carbonated beverages.  Tomatoes, cinnamon are irritating to the gum.  For mild symptoms you can take over the counter antihistamines such as Benadryl 25mg -50mg  and monitor symptoms closely. If symptoms worsen or if you have severe symptoms including breathing issues, throat closure, significant swelling, whole body hives, severe diarrhea and vomiting, lightheadedness then seek immediate medical care.

## 2022-05-03 NOTE — Assessment & Plan Note (Signed)
>>  ASSESSMENT AND PLAN FOR OTHER ALLERGIC RHINITIS WRITTEN ON 05/03/2022  9:40 PM BY Ellamae Sia, DO  Rhino conjunctivitis symptoms and takes Singulair daily with good benefit. No prior testing.  Today's skin testing showed: Positive to grass, ragweed, weed, trees, mold, dust mites, cat and cockroach. Start environmental control measures as below. Continue Singulair (montelukast) 10mg  daily at night. Cautioned that in some children/adults can experience behavioral changes including hyperactivity, agitation, depression, sleep disturbances and suicidal ideations. These side effects are rare, but if you notice them you should notify me and discontinue Singulair (montelukast). Use over the counter antihistamines such as Zyrtec (cetirizine), Claritin (loratadine), Allegra (fexofenadine), or Xyzal (levocetirizine) daily as needed. May take twice a day during allergy flares. May switch antihistamines every few months. Consider allergy injections for long term control if above medications do not help the symptoms - handout given.  Declined eye drops and nasal sprays at this time.

## 2022-05-03 NOTE — Patient Instructions (Addendum)
Today's skin testing showed: Positive to grass, ragweed, weed, trees, mold, dust mites, cat and cockroach. Negative to select foods.   Results given.  Reactions Keep track of episodes and take pictures. If this happens again, write down what you had eaten and send me a mychart message.  Wash mouth out with plain water.  Possibly had irritation response to these foods. I don't have skin testing available for some the preservatives and other ingredients found in these foods. If this is becoming more frequent then will get bloodwork next.  Avoid irritating foods such as spicy foods, acidic foods, carbonated beverages.  Tomatoes, cinnamon are irritating to the gum.  For mild symptoms you can take over the counter antihistamines such as Benadryl 25mg -50mg  and monitor symptoms closely. If symptoms worsen or if you have severe symptoms including breathing issues, throat closure, significant swelling, whole body hives, severe diarrhea and vomiting, lightheadedness then seek immediate medical care.  Environmental allergies Start environmental control measures as below. Continue Singulair (montelukast) 10mg  daily at night. Cautioned that in some children/adults can experience behavioral changes including hyperactivity, agitation, depression, sleep disturbances and suicidal ideations. These side effects are rare, but if you notice them you should notify me and discontinue Singulair (montelukast). Use over the counter antihistamines such as Zyrtec (cetirizine), Claritin (loratadine), Allegra (fexofenadine), or Xyzal (levocetirizine) daily as needed. May take twice a day during allergy flares. May switch antihistamines every few months. Consider allergy injections for long term control if above medications do not help the symptoms - handout given.   Follow up in 6 months or sooner if needed.    Reducing Pollen Exposure Pollen seasons: trees (spring), grass (summer) and ragweed/weeds (fall). Keep  windows closed in your home and car to lower pollen exposure.  Install air conditioning in the bedroom and throughout the house if possible.  Avoid going out in dry windy days - especially early morning. Pollen counts are highest between 5 - 10 AM and on dry, hot and windy days.  Save outside activities for late afternoon or after a heavy rain, when pollen levels are lower.  Avoid mowing of grass if you have grass pollen allergy. Be aware that pollen can also be transported indoors on people and pets.  Dry your clothes in an automatic dryer rather than hanging them outside where they might collect pollen.  Rinse hair and eyes before bedtime. Mold Control Mold and fungi can grow on a variety of surfaces provided certain temperature and moisture conditions exist.  Outdoor molds grow on plants, decaying vegetation and soil. The major outdoor mold, Alternaria and Cladosporium, are found in very high numbers during hot and dry conditions. Generally, a late summer - fall peak is seen for common outdoor fungal spores. Rain will temporarily lower outdoor mold spore count, but counts rise rapidly when the rainy period ends. The most important indoor molds are Aspergillus and Penicillium. Dark, humid and poorly ventilated basements are ideal sites for mold growth. The next most common sites of mold growth are the bathroom and the kitchen. Outdoor (Seasonal) Mold Control Use air conditioning and keep windows closed. Avoid exposure to decaying vegetation. Avoid leaf raking. Avoid grain handling. Consider wearing a face mask if working in moldy areas.  Indoor (Perennial) Mold Control  Maintain humidity below 50%. Get rid of mold growth on hard surfaces with water, detergent and, if necessary, 5% bleach (do not mix with other cleaners). Then dry the area completely. If mold covers an area more than 10 square  feet, consider hiring an Patent examiner. For clothing, washing with soap and water  is best. If moldy items cannot be cleaned and dried, throw them away. Remove sources e.g. contaminated carpets. Repair and seal leaking roofs or pipes. Using dehumidifiers in damp basements may be helpful, but empty the water and clean units regularly to prevent mildew from forming. All rooms, especially basements, bathrooms and kitchens, require ventilation and cleaning to deter mold and mildew growth. Avoid carpeting on concrete or damp floors, and storing items in damp areas. Control of House Dust Mite Allergen Dust mite allergens are a common trigger of allergy and asthma symptoms. While they can be found throughout the house, these microscopic creatures thrive in warm, humid environments such as bedding, upholstered furniture and carpeting. Because so much time is spent in the bedroom, it is essential to reduce mite levels there.  Encase pillows, mattresses, and box springs in special allergen-proof fabric covers or airtight, zippered plastic covers.  Bedding should be washed weekly in hot water (130 F) and dried in a hot dryer. Allergen-proof covers are available for comforters and pillows that can't be regularly washed.  Wash the allergy-proof covers every few months. Minimize clutter in the bedroom. Keep pets out of the bedroom.  Keep humidity less than 50% by using a dehumidifier or air conditioning. You can buy a humidity measuring device called a hygrometer to monitor this.  If possible, replace carpets with hardwood, linoleum, or washable area rugs. If that's not possible, vacuum frequently with a vacuum that has a HEPA filter. Remove all upholstered furniture and non-washable window drapes from the bedroom. Remove all non-washable stuffed toys from the bedroom.  Wash stuffed toys weekly. Pet Allergen Avoidance: Contrary to popular opinion, there are no "hypoallergenic" breeds of dogs or cats. That is because people are not allergic to an animal's hair, but to an allergen found in the  animal's saliva, dander (dead skin flakes) or urine. Pet allergy symptoms typically occur within minutes. For some people, symptoms can build up and become most severe 8 to 12 hours after contact with the animal. People with severe allergies can experience reactions in public places if dander has been transported on the pet owners' clothing. Keeping an animal outdoors is only a partial solution, since homes with pets in the yard still have higher concentrations of animal allergens. Before getting a pet, ask your allergist to determine if you are allergic to animals. If your pet is already considered part of your family, try to minimize contact and keep the pet out of the bedroom and other rooms where you spend a great deal of time. As with dust mites, vacuum carpets often or replace carpet with a hardwood floor, tile or linoleum. High-efficiency particulate air (HEPA) cleaners can reduce allergen levels over time. While dander and saliva are the source of cat and dog allergens, urine is the source of allergens from rabbits, hamsters, mice and [place] pigs; so ask a non-allergic family member to clean the animal's cage. If you have a pet allergy, talk to your allergist about the potential for allergy immunotherapy (allergy shots). This strategy can often provide long-term relief. Cockroach Allergen Avoidance Cockroaches are often found in the homes of densely populated urban areas, schools or commercial buildings, but these creatures can lurk almost anywhere. This does not mean that you have a dirty house or living area. Block all areas where roaches can enter the home. This includes crevices, wall cracks and windows.  Cockroaches need water  to survive, so fix and seal all leaky faucets and pipes. Have an exterminator go through the house when your family and pets are gone to eliminate any remaining roaches. Keep food in lidded containers and put pet food dishes away after your pets are done eating. Vacuum  and sweep the floor after meals, and take out garbage and recyclables. Use lidded garbage containers in the kitchen. Wash dishes immediately after use and clean under stoves, refrigerators or toasters where crumbs can accumulate. Wipe off the stove and other kitchen surfaces and cupboards regularly.

## 2022-05-03 NOTE — Assessment & Plan Note (Signed)
Rhino conjunctivitis symptoms and takes Singulair daily with good benefit. No prior testing.  Today's skin testing showed: Positive to grass, ragweed, weed, trees, mold, dust mites, cat and cockroach. Start environmental control measures as below. Continue Singulair (montelukast) 10mg  daily at night. Cautioned that in some children/adults can experience behavioral changes including hyperactivity, agitation, depression, sleep disturbances and suicidal ideations. These side effects are rare, but if you notice them you should notify me and discontinue Singulair (montelukast). Use over the counter antihistamines such as Zyrtec (cetirizine), Claritin (loratadine), Allegra (fexofenadine), or Xyzal (levocetirizine) daily as needed. May take twice a day during allergy flares. May switch antihistamines every few months. Consider allergy injections for long term control if above medications do not help the symptoms - handout given.  Declined eye drops and nasal sprays at this time.

## 2022-05-05 ENCOUNTER — Encounter: Payer: Self-pay | Admitting: Physician Assistant

## 2022-06-15 ENCOUNTER — Encounter: Payer: Self-pay | Admitting: Physician Assistant

## 2022-06-17 ENCOUNTER — Encounter: Payer: BC Managed Care – PPO | Admitting: Physician Assistant

## 2022-06-21 ENCOUNTER — Other Ambulatory Visit: Payer: Self-pay | Admitting: Allergy

## 2022-06-21 DIAGNOSIS — J301 Allergic rhinitis due to pollen: Secondary | ICD-10-CM | POA: Diagnosis not present

## 2022-06-21 DIAGNOSIS — J3089 Other allergic rhinitis: Secondary | ICD-10-CM

## 2022-06-21 NOTE — Progress Notes (Signed)
Aeroallergen Immunotherapy   Ordering Provider: Dr. Rexene Alberts   Patient Details  Name: [name]  MRN: [id number]  Date of Birth: Dec 05, 1975   Order 1 of 2   Vial Label: [id number]   0.3 ml (Volume)  BAU Concentration -- 7 Grass Mix* 100,000 ([address] [place], [place], [place], Perennial [place], [place], Sweet [place], [place])  0.2 ml (Volume)  1:20 Concentration -- [place]  0.3 ml (Volume)  1:20 Concentration -- Ragweed Mix  0.5 ml (Volume)  1:20 Concentration -- Weed Mix*  0.5 ml (Volume)  1:20 Concentration -- Eastern 10 Tree Mix (also Sweet Gum)  0.2 ml (Volume)  1:10 Concentration -- Pecan Pollen  0.2 ml (Volume)  1:20 Concentration -- Walnut, Black Pollen    2.2  ml Extract Subtotal  2.8  ml Diluent  5.0  ml Maintenance Total   Schedule:  B  Blue Vial (1:100,000): Schedule B (6 doses)  Yellow Vial (1:10,000): Schedule B (6 doses)  Green Vial (1:1,000): Schedule B (6 doses)  Red Vial (1:100): Schedule B (14 doses)   Special Instructions: may come 1-2 times per week during build up.

## 2022-06-21 NOTE — Progress Notes (Signed)
Aeroallergen Immunotherapy   Ordering Provider: Dr. Rexene Alberts   Patient Details  Name: [name]  MRN: [id number]  Date of Birth: 1975/10/02   Order 2 of 2   Vial Label: M-[id number]-[id number]-Cr   0.2 ml (Volume)  1:20 Concentration -- Alternaria alternata  0.2 ml (Volume)  1:20 Concentration -- Cladosporium herbarum  0.2 ml (Volume)  1:10 Concentration -- Aspergillus mix  0.2 ml (Volume)  1:10 Concentration -- Penicillium mix  0.2 ml (Volume)  1:20 Concentration -- Bipolaris sorokiniana  0.2 ml (Volume)  1:20 Concentration -- Drechslera spicifera  0.2 ml (Volume)  1:10 Concentration -- Mucor plumbeus  0.2 ml (Volume)  1:10 Concentration -- Rhizopus oryzae  0.2 ml (Volume)  1:40 Concentration -- Botrytis cinerea  0.5 ml (Volume)  1:10 Concentration -- Cat Hair  0.3 ml (Volume)  1:20 Concentration -- Cockroach, German  0.5 ml (Volume)   AU Concentration -- Mite Mix (DF 5,000 & DP 5,000)    3.1  ml Extract Subtotal  1.9  ml Diluent  5.0  ml Maintenance Total   Schedule:  B  Blue Vial (1:100,000): Schedule B (6 doses)  Yellow Vial (1:10,000): Schedule B (6 doses)  Green Vial (1:1,000): Schedule B (6 doses)  Red Vial (1:100): Schedule B (14 doses)   Special Instructions: may come 1-2 times per week during build up.

## 2022-06-21 NOTE — Progress Notes (Signed)
VIALS EXP 06-21-23

## 2022-06-22 DIAGNOSIS — J3089 Other allergic rhinitis: Secondary | ICD-10-CM | POA: Diagnosis not present

## 2022-06-24 ENCOUNTER — Other Ambulatory Visit: Payer: Self-pay | Admitting: Physician Assistant

## 2022-06-24 DIAGNOSIS — F429 Obsessive-compulsive disorder, unspecified: Secondary | ICD-10-CM

## 2022-06-24 DIAGNOSIS — F325 Major depressive disorder, single episode, in full remission: Secondary | ICD-10-CM

## 2022-06-24 DIAGNOSIS — F411 Generalized anxiety disorder: Secondary | ICD-10-CM

## 2022-07-07 ENCOUNTER — Ambulatory Visit: Payer: BC Managed Care – PPO

## 2022-07-12 ENCOUNTER — Ambulatory Visit (INDEPENDENT_AMBULATORY_CARE_PROVIDER_SITE_OTHER): Payer: BC Managed Care – PPO

## 2022-07-12 ENCOUNTER — Encounter: Payer: Self-pay | Admitting: Physician Assistant

## 2022-07-12 DIAGNOSIS — Z1211 Encounter for screening for malignant neoplasm of colon: Secondary | ICD-10-CM

## 2022-07-12 DIAGNOSIS — J309 Allergic rhinitis, unspecified: Secondary | ICD-10-CM

## 2022-07-12 NOTE — Progress Notes (Signed)
Immunotherapy   Patient Details  Name: [name] MRN: [id number] Date of Birth: [birth year]  07/12/2022  [name] started injections for  blue vial Following schedule: bB  Frequency:2 times per week Epi-Pen:Epi-Pen Available  Consent signed and patient instructions given.   Berna Bue 07/12/2022, 3:28 PM

## 2022-07-19 ENCOUNTER — Ambulatory Visit (INDEPENDENT_AMBULATORY_CARE_PROVIDER_SITE_OTHER): Payer: BC Managed Care – PPO

## 2022-07-19 DIAGNOSIS — J309 Allergic rhinitis, unspecified: Secondary | ICD-10-CM | POA: Diagnosis not present

## 2022-07-26 ENCOUNTER — Ambulatory Visit (INDEPENDENT_AMBULATORY_CARE_PROVIDER_SITE_OTHER): Payer: BC Managed Care – PPO

## 2022-07-26 DIAGNOSIS — J309 Allergic rhinitis, unspecified: Secondary | ICD-10-CM | POA: Diagnosis not present

## 2022-07-28 ENCOUNTER — Encounter: Payer: Self-pay | Admitting: Physician Assistant

## 2022-07-28 ENCOUNTER — Telehealth: Payer: BC Managed Care – PPO | Admitting: Physician Assistant

## 2022-07-28 DIAGNOSIS — J040 Acute laryngitis: Secondary | ICD-10-CM

## 2022-07-28 MED ORDER — PREDNISONE 20 MG PO TABS: 40.0000 mg | ORAL_TABLET | Freq: Every day | ORAL | 0 refills | Status: DC

## 2022-07-28 MED ORDER — BENZONATATE 100 MG PO CAPS: 100.0000 mg | ORAL_CAPSULE | Freq: Three times a day (TID) | ORAL | 0 refills | Status: DC | PRN

## 2022-07-28 NOTE — Progress Notes (Signed)
Virtual Visit Consent   [name], you are scheduled for a virtual visit with a [facility] provider today. Just as with appointments in the office, your consent must be obtained to participate. Your consent will be active for this visit and any virtual visit you may have with one of our providers in the next 365 days. If you have a MyChart account, a copy of this consent can be sent to you electronically.  As this is a virtual visit, video technology does not allow for your provider to perform a traditional examination. This may limit your provider's ability to fully assess your condition. If your provider identifies any concerns that need to be evaluated in person or the need to arrange testing (such as labs, EKG, etc.), we will make arrangements to do so. Although advances in technology are sophisticated, we cannot ensure that it will always work on either your end or our end. If the connection with a video visit is poor, the visit may have to be switched to a telephone visit. With either a video or telephone visit, we are not always able to ensure that we have a secure connection.  By engaging in this virtual visit, you consent to the provision of healthcare and authorize for your insurance to be billed (if applicable) for the services provided during this visit. Depending on your insurance coverage, you may receive a charge related to this service.  I need to obtain your verbal consent now. Are you willing to proceed with your visit today? [name] has provided verbal consent on 07/28/2022 for a virtual visit (video or telephone). Piedad Climes, [place]  Date: 07/28/2022 2:55 PM  Virtual Visit via Video Note   I, Piedad Climes, connected with  [name]  ([id number], May 31, 1975) on 07/28/22 at  2:45 PM EDT by a video-enabled telemedicine application and verified that I am speaking with the correct person using two identifiers.  Location: Patient: Virtual Visit Location Patient:  Home Provider: Virtual Visit Location Provider: Home Office   I discussed the limitations of evaluation and management by telemedicine and the availability of in person appointments. The patient expressed understanding and agreed to proceed.    History of Present Illness: [name] is a 47 y.o. who identifies as a female who was assigned female at birth, and is being seen today for possible laryngitis. Notes symptoms starting yesterday with voice hoarseness, along with some nasal congestion and dry cough.  Denies any substantial chest congestion, wheezing or shortness of breath.  Denies fever, chills, recent travel.  Denies known sick contact but she works as a Runner, broadcasting/film/video.  HPI: HPI  Problems:  Patient Active Problem List   Diagnosis Date Noted   Other adverse food reactions, not elsewhere classified, subsequent encounter 05/03/2022   Other allergic rhinitis 05/03/2022   Angioedema of lips 05/03/2022   ETD (Eustachian tube dysfunction), left 12/13/2021   Cold intolerance 11/03/2020   Panic attack 11/27/2019   Allergic sinusitis 08/01/2019   Depression 12/11/2017   Menorrhagia 05/18/2015   History of HPV infection 03/06/2015   Cervical dysplasia 03/06/2015   Trouble in sleeping 03/06/2015   GAD (generalized anxiety disorder) 02/03/2015   OCD (obsessive compulsive disorder) 02/03/2015   Chest pain 10/16/2013   Hyperlipidemia 10/16/2013    Allergies:  Allergies  Allergen Reactions   Trazodone And Nefazodone     Bad nightmares and feeling groggy.   Medications:  Current Outpatient Medications:    benzonatate (TESSALON) 100  MG capsule, Take 1 capsule (100 mg total) by mouth 3 (three) times daily as needed for cough., Disp: 30 capsule, Rfl: 0   predniSONE (DELTASONE) 20 MG tablet, Take 2 tablets (40 mg total) by mouth daily with breakfast., Disp: 6 tablet, Rfl: 0   EPINEPHrine (EPIPEN 2-PAK) 0.3 mg/0.3 mL IJ SOAJ injection, Inject 0.3 mg into the muscle as needed for anaphylaxis.,  Disp: 1 each, Rfl: 1   hydrOXYzine (VISTARIL) 25 MG capsule, TAKE ONE TO TWO TABLETS AS NEEDED FOR ALLERGIC REACTION/SWELLING/ITCHING., Disp: 180 capsule, Rfl: 1   montelukast (SINGULAIR) 10 MG tablet, TAKE 1 TABLET BY MOUTH EVERYDAY AT BEDTIME, Disp: 90 tablet, Rfl: 3   sertraline (ZOLOFT) 50 MG tablet, TAKE ONE AND ONE-HALF TABLET DAILY., Disp: 135 tablet, Rfl: 0  Observations/Objective: Patient is well-developed, well-nourished in no acute distress.  Resting comfortably at home.  Head is normocephalic, atraumatic.  No labored breathing. Very hoarse voice. Speech is clear and coherent with logical content.  Patient is alert and oriented at baseline.   Assessment and Plan: 1. Laryngitis - predniSONE (DELTASONE) 20 MG tablet; Take 2 tablets (40 mg total) by mouth daily with breakfast.  Dispense: 6 tablet; Refill: 0 - benzonatate (TESSALON) 100 MG capsule; Take 1 capsule (100 mg total) by mouth 3 (three) times daily as needed for cough.  Dispense: 30 capsule; Refill: 0    Follow Up Instructions: I discussed the assessment and treatment plan with the patient. The patient was provided an opportunity to ask questions and all were answered. The patient agreed with the plan and demonstrated an understanding of the instructions.  A copy of instructions were sent to the patient via MyChart unless otherwise noted below.   The patient was advised to call back or seek an in-person evaluation if the symptoms worsen or if the condition fails to improve as anticipated.  Time:  I spent 10 minutes with the patient via telehealth technology discussing the above problems/concerns.    Piedad Climes, PA-C

## 2022-07-28 NOTE — Patient Instructions (Signed)
  Jobeth L Risdon, thank you for joining Piedad Climes, PA-C for today's virtual visit.  While this provider is not your primary care provider (PCP), if your PCP is located in our provider database this encounter information will be shared with them immediately following your visit.   A [facility] MyChart account gives you access to today's visit and all your visits, tests, and labs performed at [facility] " click here if you don't have a [facility] MyChart account or go to [email or web address]  Consent: (Patient) [name] provided verbal consent for this virtual visit at the beginning of the encounter.  Current Medications:  Current Outpatient Medications:    EPINEPHrine (EPIPEN 2-PAK) 0.3 mg/0.3 mL IJ SOAJ injection, Inject 0.3 mg into the muscle as needed for anaphylaxis., Disp: 1 each, Rfl: 1   hydrOXYzine (VISTARIL) 25 MG capsule, TAKE ONE TO TWO TABLETS AS NEEDED FOR ALLERGIC REACTION/SWELLING/ITCHING., Disp: 180 capsule, Rfl: 1   montelukast (SINGULAIR) 10 MG tablet, TAKE 1 TABLET BY MOUTH EVERYDAY AT BEDTIME, Disp: 90 tablet, Rfl: 3   sertraline (ZOLOFT) 50 MG tablet, TAKE ONE AND ONE-HALF TABLET DAILY., Disp: 135 tablet, Rfl: 0   Medications ordered in this encounter:  No orders of the defined types were placed in this encounter.    *If you need refills on other medications prior to your next appointment, please contact your pharmacy*  Follow-Up: Call back or seek an in-person evaluation if the symptoms worsen or if the condition fails to improve as anticipated.  [facility] [phone]  Other Instructions Please keep well-hydrated and rest. Run a humidifier in the bedroom at night. Rest your voice is much as possible. Take the steroid as directed. Okay to use OTC cough medicines like Mucinex DM, Delsym or Robitussin. You can take the prescribed Tessalon along with the over-the-counter cough medicines. If symptoms or not resolving  or you note any new or worsening symptoms, please let us know or seek an in person evaluation.   If you have been instructed to have an in-person evaluation today at a local Urgent Care facility, please use the link below. It will take you to a list of all of our available [facility], including address, phone number and hours of operation. Please do not delay care.  [facility]  If you or a family member do not have a primary care provider, use the link below to schedule a visit and establish care. When you choose a [facility] primary care physician or advanced practice provider, you gain a long-term partner in health. Find a Primary Care Provider  Learn more about [facility]'s in-office and virtual care options:  - Get Care Now

## 2022-08-02 ENCOUNTER — Ambulatory Visit (INDEPENDENT_AMBULATORY_CARE_PROVIDER_SITE_OTHER): Payer: BC Managed Care – PPO

## 2022-08-02 DIAGNOSIS — J309 Allergic rhinitis, unspecified: Secondary | ICD-10-CM | POA: Diagnosis not present

## 2022-08-09 ENCOUNTER — Encounter: Payer: Self-pay | Admitting: Physician Assistant

## 2022-08-09 ENCOUNTER — Ambulatory Visit (INDEPENDENT_AMBULATORY_CARE_PROVIDER_SITE_OTHER): Payer: BC Managed Care – PPO

## 2022-08-09 DIAGNOSIS — J309 Allergic rhinitis, unspecified: Secondary | ICD-10-CM | POA: Diagnosis not present

## 2022-08-09 MED ORDER — AZITHROMYCIN 250 MG PO TABS: ORAL_TABLET | ORAL | 0 refills | Status: DC

## 2022-08-18 ENCOUNTER — Ambulatory Visit (INDEPENDENT_AMBULATORY_CARE_PROVIDER_SITE_OTHER): Payer: BC Managed Care – PPO

## 2022-08-18 DIAGNOSIS — J309 Allergic rhinitis, unspecified: Secondary | ICD-10-CM

## 2022-08-25 ENCOUNTER — Ambulatory Visit (INDEPENDENT_AMBULATORY_CARE_PROVIDER_SITE_OTHER): Payer: BC Managed Care – PPO

## 2022-08-25 DIAGNOSIS — J309 Allergic rhinitis, unspecified: Secondary | ICD-10-CM

## 2022-09-01 ENCOUNTER — Ambulatory Visit (INDEPENDENT_AMBULATORY_CARE_PROVIDER_SITE_OTHER): Payer: BC Managed Care – PPO

## 2022-09-01 DIAGNOSIS — J309 Allergic rhinitis, unspecified: Secondary | ICD-10-CM

## 2022-09-08 ENCOUNTER — Ambulatory Visit (INDEPENDENT_AMBULATORY_CARE_PROVIDER_SITE_OTHER): Payer: BC Managed Care – PPO

## 2022-09-08 DIAGNOSIS — J309 Allergic rhinitis, unspecified: Secondary | ICD-10-CM

## 2022-09-20 ENCOUNTER — Ambulatory Visit (INDEPENDENT_AMBULATORY_CARE_PROVIDER_SITE_OTHER): Payer: BC Managed Care – PPO

## 2022-09-20 DIAGNOSIS — J309 Allergic rhinitis, unspecified: Secondary | ICD-10-CM | POA: Diagnosis not present

## 2022-09-27 ENCOUNTER — Other Ambulatory Visit: Payer: Self-pay | Admitting: Physician Assistant

## 2022-09-27 DIAGNOSIS — F411 Generalized anxiety disorder: Secondary | ICD-10-CM

## 2022-09-27 DIAGNOSIS — F325 Major depressive disorder, single episode, in full remission: Secondary | ICD-10-CM

## 2022-09-27 DIAGNOSIS — F429 Obsessive-compulsive disorder, unspecified: Secondary | ICD-10-CM

## 2022-10-01 ENCOUNTER — Other Ambulatory Visit: Payer: Self-pay | Admitting: Physician Assistant

## 2022-10-01 DIAGNOSIS — F325 Major depressive disorder, single episode, in full remission: Secondary | ICD-10-CM

## 2022-10-01 DIAGNOSIS — F429 Obsessive-compulsive disorder, unspecified: Secondary | ICD-10-CM

## 2022-10-01 DIAGNOSIS — F411 Generalized anxiety disorder: Secondary | ICD-10-CM

## 2022-10-04 ENCOUNTER — Ambulatory Visit (INDEPENDENT_AMBULATORY_CARE_PROVIDER_SITE_OTHER): Payer: BC Managed Care – PPO

## 2022-10-04 DIAGNOSIS — J309 Allergic rhinitis, unspecified: Secondary | ICD-10-CM

## 2022-10-05 ENCOUNTER — Ambulatory Visit (INDEPENDENT_AMBULATORY_CARE_PROVIDER_SITE_OTHER): Payer: BC Managed Care – PPO | Admitting: Plastic Surgery

## 2022-10-05 VITALS — BP 116/84 | HR 96 | Resp 16 | Ht 64.5 in | Wt 158.0 lb

## 2022-10-05 DIAGNOSIS — G8929 Other chronic pain: Secondary | ICD-10-CM

## 2022-10-05 DIAGNOSIS — M546 Pain in thoracic spine: Secondary | ICD-10-CM | POA: Diagnosis not present

## 2022-10-05 DIAGNOSIS — M542 Cervicalgia: Secondary | ICD-10-CM

## 2022-10-05 DIAGNOSIS — N62 Hypertrophy of breast: Secondary | ICD-10-CM

## 2022-10-05 DIAGNOSIS — M25512 Pain in left shoulder: Secondary | ICD-10-CM

## 2022-10-05 DIAGNOSIS — M25511 Pain in right shoulder: Secondary | ICD-10-CM

## 2022-10-10 ENCOUNTER — Encounter: Payer: Self-pay | Admitting: Plastic Surgery

## 2022-10-10 ENCOUNTER — Telehealth: Payer: Self-pay

## 2022-10-10 NOTE — Progress Notes (Signed)
Referring Provider Jomarie Longs, PA-C [address] [place],  [place] [address]   CC:  Chief Complaint  Patient presents with   Advice Only      [name] is an 47 y.o. female.  HPI: [name] is a 47 year old female who presents today with complaints of upper back and neck pain and bra straps digging into her shoulders from the large size of her breast.  States that she is considered breast reduction for many years due to this discomfort and is ready to proceed now.  She states that her breasts interfere with her daily activities and make it difficult to find garments that fit appropriately.  Allergies  Allergen Reactions   Trazodone And Nefazodone     Bad nightmares and feeling groggy.    Outpatient Encounter Medications as of 10/05/2022  Medication Sig   azithromycin (ZITHROMAX Z-PAK) 250 MG tablet Take 2 tablets (500 mg) on  Day 1,  followed by 1 tablet (250 mg) once daily on Days 2 through 5.   benzonatate (TESSALON) 100 MG capsule Take 1 capsule (100 mg total) by mouth 3 (three) times daily as needed for cough.   EPINEPHrine (EPIPEN 2-PAK) 0.3 mg/0.3 mL IJ SOAJ injection Inject 0.3 mg into the muscle as needed for anaphylaxis.   hydrOXYzine (VISTARIL) 25 MG capsule TAKE ONE TO TWO TABLETS AS NEEDED FOR ALLERGIC REACTION/SWELLING/ITCHING.   montelukast (SINGULAIR) 10 MG tablet TAKE 1 TABLET BY MOUTH EVERYDAY AT BEDTIME   predniSONE (DELTASONE) 20 MG tablet Take 2 tablets (40 mg total) by mouth daily with breakfast.   sertraline (ZOLOFT) 50 MG tablet TAKE ONE AND ONE-HALF TABLET DAILY.   No facility-administered encounter medications on file as of 10/05/2022.     Past Medical History:  Diagnosis Date   Abnormal Pap smear    Depression    pp   Frequent UTI    Hx of varicella    Hyperlipidemia    Kidney stones     Past Surgical History:  Procedure Laterality Date   DILATION AND CURETTAGE OF UTERUS     HYSTERECTOMY ABDOMINAL WITH SALPINGECTOMY      KIDNEY STONE SURGERY     LEEP      Family History  Problem Relation Age of Onset   Diabetes Father    CAD Father        Died of MI at age 21   Cancer Paternal Aunt        pancreatic   Alcohol abuse Maternal Grandfather    Anemia Mother    Cancer Mother        bone   Hyperlipidemia Mother    Anemia Brother    Hypertension Maternal Grandmother    Breast cancer Neg Hx     Social History   Social History Narrative   Not on file     Review of Systems General: Denies fevers, chills, weight loss CV: Denies chest pain, shortness of breath, palpitations Breast: Patient denies any specific breast pathology.  She feels that the large size of her breasts are contributing to her upper back and neck pain and to her discomfort from her bra straps digging into her shoulders.  Physical Exam    10/05/2022    3:12 PM 10/05/2022    3:11 PM 05/03/2022    2:58 PM  Vitals with BMI  Height  5' 4.5" 5' 5.25"  Weight  158 lbs 155 lbs  BMI  26.71 25.61  Systolic 116  102  Diastolic 84  60  Pulse 96  54    General:  No acute distress,  Alert and oriented, Non-Toxic, Normal speech and affect Breast: On examination the patient has moderately large breasts with grade 1 ptosis.  There are no masses on examination and the nipples are normal in appearance without obvious nipple discharge.  Her sternal notch to nipple distance is 29 on the right and 28 on the left.  Her nipple to fold distance on the right is 18 and 16 on the left. Mammogram: Mammogram in January 2023 was BI-RADS 2 the patient will need a mammogram.  I placed an order for this Assessment/Plan Macromastia: Patient is a good candidate for bilateral breast reduction.  We discussed the location of the incisions and the unpredictable nature of scarring.  We discussed the risks of bleeding, infection, and seroma formation.  We discussed the risks of nipple loss due to nipple ischemia and the possibility for changes in sensation in the nipples.   We discussed the possibility that mammograms will be more difficult to interpret in the future.  We discussed the postoperative limitations including no heavy lifting greater than 20 pounds, no vigorous activity, no submerging the incisions in water for 6 weeks.  She understands that I will use drains which will need to be removed after surgery.  She understands she will need to wear a compressive supportive garment for 6 weeks.  All questions were answered to her satisfaction.  Photographs were obtained today with her consent.  Will schedule for bilateral breast reduction at her request.  Santiago Glad 10/10/2022, 8:43 AM

## 2022-10-10 NOTE — Telephone Encounter (Signed)
L/M informing patient a mammogram would be needed before scheduling surgery. Order has been placed.

## 2022-10-13 ENCOUNTER — Ambulatory Visit (INDEPENDENT_AMBULATORY_CARE_PROVIDER_SITE_OTHER): Payer: BC Managed Care – PPO

## 2022-10-13 ENCOUNTER — Telehealth: Payer: Self-pay | Admitting: *Deleted

## 2022-10-13 DIAGNOSIS — J309 Allergic rhinitis, unspecified: Secondary | ICD-10-CM

## 2022-10-13 NOTE — Telephone Encounter (Signed)
Called and spoke with the patient and informed her of the message below.  Patient stated that she will give [facility] a call to schedule an appointment for a Mammogram.//AB/CMA

## 2022-10-13 NOTE — Telephone Encounter (Signed)
-----   Message from Santiago Glad sent at 10/10/2022  8:48 AM EDT ----- Regarding: [facility]amm[facility],  Will you please let Ms Pidcock know she will need a mammogram prior to scheduling her breast reduction?  I have placed an order for her.  Thank you, Ree Kida

## 2022-10-22 ENCOUNTER — Other Ambulatory Visit: Payer: Self-pay | Admitting: Physician Assistant

## 2022-10-22 DIAGNOSIS — T783XXA Angioneurotic edema, initial encounter: Secondary | ICD-10-CM

## 2022-10-22 DIAGNOSIS — L299 Pruritus, unspecified: Secondary | ICD-10-CM

## 2022-10-31 DIAGNOSIS — H1010 Acute atopic conjunctivitis, unspecified eye: Secondary | ICD-10-CM | POA: Insufficient documentation

## 2022-10-31 NOTE — Progress Notes (Unsigned)
Follow Up Note  RE: [name] MRN: [id number] DOB: [birth year] Date of Office Visit: 11/01/2022  Referring provider: Nolene Ebbs Primary care provider: Jomarie Longs, PA-C  Chief Complaint: No chief complaint on file.  History of Present Illness: I had the pleasure of seeing [name] for a follow up visit at the [facility] on 10/31/2022. She is a 47 y.o. female, who is being followed for adverse food reaction, allergic rhinoconjunctivitis on AIT. Her previous allergy office visit was on 05/03/2022 with Dr. Selena Batten. Today is a regular follow up visit.  07/12/2022  m-[id number]-c-cr  g-w-rw-t   Other adverse food reactions, not elsewhere classified, subsequent encounter 4 episodes of tingling in her gums with itchy lower face, lower lip swelling and bumps inside her mouth. No respiratory compromise. Occurred after eating doritos, cinnamon toast crunch cereal, lasagna and cinnamon coffee creamer which are all foods she had before with no issues. Symptoms resolve within 1 hour. Denies any other changes in diet, meds or personal care products. Concerned about food allergies. Today's skin testing showed: Negative to select foods.  Keep track of episodes and take pictures. If this happens again, write down what you had eaten and send me a mychart message.  Wash mouth out with plain water.  Possibly had irritation response to these foods. I don't have skin testing available for some the preservatives and other ingredients found in these foods. If this is becoming more frequent then will get bloodwork next. Avoid irritating foods such as spicy foods, acidic foods, carbonated beverages.  Tomatoes, cinnamon are irritating to the gum.  For mild symptoms you can take over the counter antihistamines such as Benadryl 25mg -50mg  and monitor symptoms closely. If symptoms worsen or if you have severe symptoms including breathing issues, throat closure, significant swelling, whole body  hives, severe diarrhea and vomiting, lightheadedness then seek immediate medical care.   Other allergic rhinitis Rhino conjunctivitis symptoms and takes Singulair daily with good benefit. No prior testing.  Today's skin testing showed: Positive to grass, ragweed, weed, trees, mold, dust mites, cat and cockroach. Start environmental control measures as below. Continue Singulair (montelukast) 10mg  daily at night. Cautioned that in some children/adults can experience behavioral changes including hyperactivity, agitation, depression, sleep disturbances and suicidal ideations. These side effects are rare, but if you notice them you should notify me and discontinue Singulair (montelukast). Use over the counter antihistamines such as Zyrtec (cetirizine), Claritin (loratadine), Allegra (fexofenadine), or Xyzal (levocetirizine) daily as needed. May take twice a day during allergy flares. May switch antihistamines every few months. Consider allergy injections for long term control if above medications do not help the symptoms - handout given.  Declined eye drops and nasal sprays at this time.    Return in about 6 months (around 11/01/2022).  Assessment and Plan: [name] is a 47 y.o. female with: No problem-specific Assessment & Plan notes found for this encounter.  No follow-ups on file.  No orders of the defined types were placed in this encounter.  Lab Orders  No laboratory test(s) ordered today    Diagnostics: Spirometry:  Tracings reviewed. Her effort: {Blank single:19197::"Good reproducible efforts.","It was hard to get consistent efforts and there is a question as to whether this reflects a maximal maneuver.","Poor effort, data can not be interpreted."} FVC: ***L FEV1: ***L, ***% predicted FEV1/FVC ratio: ***% Interpretation: {Blank single:19197::"Spirometry consistent with mild obstructive disease","Spirometry consistent with moderate obstructive disease","Spirometry consistent with severe  obstructive disease","Spirometry consistent with  possible restrictive disease","Spirometry consistent with mixed obstructive and restrictive disease","Spirometry uninterpretable due to technique","Spirometry consistent with normal pattern","No overt abnormalities noted given today's efforts"}.  Please see scanned spirometry results for details.  Skin Testing: {Blank single:19197::"Select foods","Environmental allergy panel","Environmental allergy panel and select foods","Food allergy panel","None","Deferred due to recent antihistamines use"}. *** Results discussed with patient/family.   Medication List:  Current Outpatient Medications  Medication Sig Dispense Refill   azithromycin (ZITHROMAX Z-PAK) 250 MG tablet Take 2 tablets (500 mg) on  Day 1,  followed by 1 tablet (250 mg) once daily on Days 2 through 5. 6 tablet 0   benzonatate (TESSALON) 100 MG capsule Take 1 capsule (100 mg total) by mouth 3 (three) times daily as needed for cough. 30 capsule 0   EPINEPHrine (EPIPEN 2-PAK) 0.3 mg/0.3 mL IJ SOAJ injection Inject 0.3 mg into the muscle as needed for anaphylaxis. 1 each 1   hydrOXYzine (VISTARIL) 25 MG capsule TAKE ONE TO TWO CAPSULES AS NEEDED FOR ALLERGIC REACTION/SWELLING/ITCHING. 180 capsule 1   montelukast (SINGULAIR) 10 MG tablet TAKE 1 TABLET BY MOUTH EVERYDAY AT BEDTIME 90 tablet 3   predniSONE (DELTASONE) 20 MG tablet Take 2 tablets (40 mg total) by mouth daily with breakfast. 6 tablet 0   sertraline (ZOLOFT) 50 MG tablet TAKE ONE AND ONE-HALF TABLET DAILY. 45 tablet 0   No current facility-administered medications for this visit.   Allergies: Allergies  Allergen Reactions   Trazodone And Nefazodone     Bad nightmares and feeling groggy.   I reviewed her past medical history, social history, family history, and environmental history and no significant changes have been reported from her previous visit.  Review of Systems  Constitutional:  Negative for appetite change,  chills, fever and unexpected weight change.  HENT:  Negative for congestion and rhinorrhea.   Eyes:  Negative for itching.  Respiratory:  Negative for cough, chest tightness, shortness of breath and wheezing.   Cardiovascular:  Negative for chest pain.  Gastrointestinal:  Negative for abdominal pain.  Genitourinary:  Negative for difficulty urinating.  Skin:  Negative for rash.  Allergic/Immunologic: Positive for environmental allergies.  Neurological:  Negative for headaches.    Objective: There were no vitals taken for this visit. There is no height or weight on file to calculate BMI. Physical Exam Vitals and nursing note reviewed.  Constitutional:      Appearance: Normal appearance. She is well-developed.  HENT:     Head: Normocephalic and atraumatic.     Right Ear: Tympanic membrane and external ear normal.     Left Ear: Tympanic membrane and external ear normal.     Nose: Nose normal.     Mouth/Throat:     Mouth: Mucous membranes are moist.     Pharynx: Oropharynx is clear.  Eyes:     Conjunctiva/sclera: Conjunctivae normal.  Cardiovascular:     Rate and Rhythm: Normal rate and regular rhythm.     Heart sounds: Normal heart sounds. No murmur heard.    No friction rub. No gallop.  Pulmonary:     Effort: Pulmonary effort is normal.     Breath sounds: Normal breath sounds. No wheezing, rhonchi or rales.  Musculoskeletal:     Cervical back: Neck supple.  Skin:    General: Skin is warm.     Findings: No rash.  Neurological:     Mental Status: She is alert and oriented to person, place, and time.  Psychiatric:        Behavior: Behavior  normal.    Previous notes and tests were reviewed. The plan was reviewed with the patient/family, and all questions/concerned were addressed.  It was my pleasure to see [name] today and participate in her care. Please feel Sledd to contact me with any questions or concerns.  Sincerely,  Wyline Mood, DO Allergy & Immunology  [facility] office: [phone] [facility] office: [phone]

## 2022-11-01 ENCOUNTER — Ambulatory Visit (INDEPENDENT_AMBULATORY_CARE_PROVIDER_SITE_OTHER): Payer: BC Managed Care – PPO | Admitting: Allergy

## 2022-11-01 ENCOUNTER — Encounter: Payer: Self-pay | Admitting: Allergy

## 2022-11-01 ENCOUNTER — Other Ambulatory Visit: Payer: Self-pay

## 2022-11-01 VITALS — BP 108/84 | HR 80 | Temp 97.8°F | Resp 16 | Wt 161.0 lb

## 2022-11-01 DIAGNOSIS — H1013 Acute atopic conjunctivitis, bilateral: Secondary | ICD-10-CM

## 2022-11-01 DIAGNOSIS — L246 Irritant contact dermatitis due to food in contact with skin: Secondary | ICD-10-CM

## 2022-11-01 DIAGNOSIS — J3081 Allergic rhinitis due to animal (cat) (dog) hair and dander: Secondary | ICD-10-CM

## 2022-11-01 DIAGNOSIS — J302 Other seasonal allergic rhinitis: Secondary | ICD-10-CM

## 2022-11-01 DIAGNOSIS — J3089 Other allergic rhinitis: Secondary | ICD-10-CM

## 2022-11-01 DIAGNOSIS — T781XXD Other adverse food reactions, not elsewhere classified, subsequent encounter: Secondary | ICD-10-CM | POA: Diagnosis not present

## 2022-11-01 DIAGNOSIS — H1010 Acute atopic conjunctivitis, unspecified eye: Secondary | ICD-10-CM

## 2022-11-01 DIAGNOSIS — J301 Allergic rhinitis due to pollen: Secondary | ICD-10-CM

## 2022-11-01 MED ORDER — MONTELUKAST SODIUM 10 MG PO TABS
10.0000 mg | ORAL_TABLET | Freq: Every day | ORAL | 3 refills | Status: DC
Start: 2022-11-01 — End: 2023-11-30

## 2022-11-01 NOTE — Patient Instructions (Addendum)
Environmental allergies 2024 skin testing showed: Positive to grass, ragweed, weed, trees, mold, dust mites, cat and cockroach. Continue environmental control measures.  Continue Singulair (montelukast) 10mg  daily at night. Cautioned that in some children/adults can experience behavioral changes including hyperactivity, agitation, depression, sleep disturbances and suicidal ideations. These side effects are rare, but if you notice them you should notify me and discontinue Singulair (montelukast). Use over the counter antihistamines such as Zyrtec (cetirizine), Claritin (loratadine), Allegra (fexofenadine), or Xyzal (levocetirizine) daily as needed. May take twice a day during allergy flares. May switch antihistamines every few months. You can get generic cetirizine which is cheaper at [employer], [employer] or [employer].  Continue allergy injections - given today.  Carry epinephrine on the days of your injections.   Reactions Keep track of episodes Avoid foods that seem to be irritating to your skin.   Follow up in 12 months or sooner if needed.

## 2022-11-01 NOTE — Assessment & Plan Note (Signed)
Past history - 4 episodes of tingling in her gums with itchy lower face, lower lip swelling and bumps inside her mouth. Occurred after eating doritos, cinnamon toast crunch cereal, lasagna and cinnamon coffee creamer which are all foods she had before with no issues. Symptoms resolve within 1 hour. 2024 skin testing showed: Negative to select foods.  Interim history - tolerates spaghetti and certain cinnamon with no issues.  Keep track of episodes Avoid foods that seem to be irritating to your skin.

## 2022-11-01 NOTE — Assessment & Plan Note (Signed)
Past history - 2024 skin testing showed: Positive to grass, ragweed, weed, trees, mold, dust mites, cat and cockroach. Started AIT on 07/12/22 (m-dm-c-cr & g-w-rw-t). Interim history - some localized reactions with AIT.  Continue environmental control measures.  Continue Singulair (montelukast) 10mg  daily at night. Use over the counter antihistamines such as Zyrtec (cetirizine), Claritin (loratadine), Allegra (fexofenadine), or Xyzal (levocetirizine) daily as needed. May take twice a day during allergy flares. May switch antihistamines every few months. Continue allergy injections - given today.  Carry epinephrine on the days of your injections.

## 2022-11-09 ENCOUNTER — Other Ambulatory Visit: Payer: Self-pay | Admitting: Physician Assistant

## 2022-11-09 DIAGNOSIS — F429 Obsessive-compulsive disorder, unspecified: Secondary | ICD-10-CM

## 2022-11-09 DIAGNOSIS — F325 Major depressive disorder, single episode, in full remission: Secondary | ICD-10-CM

## 2022-11-09 DIAGNOSIS — F411 Generalized anxiety disorder: Secondary | ICD-10-CM

## 2022-11-15 ENCOUNTER — Ambulatory Visit (INDEPENDENT_AMBULATORY_CARE_PROVIDER_SITE_OTHER): Payer: BC Managed Care – PPO

## 2022-11-15 DIAGNOSIS — J309 Allergic rhinitis, unspecified: Secondary | ICD-10-CM | POA: Diagnosis not present

## 2022-11-24 ENCOUNTER — Ambulatory Visit (INDEPENDENT_AMBULATORY_CARE_PROVIDER_SITE_OTHER): Payer: BC Managed Care – PPO

## 2022-11-24 DIAGNOSIS — J309 Allergic rhinitis, unspecified: Secondary | ICD-10-CM | POA: Diagnosis not present

## 2022-11-29 ENCOUNTER — Ambulatory Visit: Payer: BC Managed Care – PPO

## 2022-11-29 DIAGNOSIS — J309 Allergic rhinitis, unspecified: Secondary | ICD-10-CM

## 2022-11-30 ENCOUNTER — Telehealth: Payer: BC Managed Care – PPO | Admitting: Student

## 2022-12-08 ENCOUNTER — Ambulatory Visit (INDEPENDENT_AMBULATORY_CARE_PROVIDER_SITE_OTHER): Payer: BC Managed Care – PPO

## 2022-12-08 DIAGNOSIS — J309 Allergic rhinitis, unspecified: Secondary | ICD-10-CM | POA: Diagnosis not present

## 2022-12-13 ENCOUNTER — Ambulatory Visit (INDEPENDENT_AMBULATORY_CARE_PROVIDER_SITE_OTHER): Payer: BC Managed Care – PPO

## 2022-12-13 DIAGNOSIS — J309 Allergic rhinitis, unspecified: Secondary | ICD-10-CM

## 2022-12-22 ENCOUNTER — Ambulatory Visit (INDEPENDENT_AMBULATORY_CARE_PROVIDER_SITE_OTHER): Payer: BC Managed Care – PPO

## 2022-12-22 DIAGNOSIS — J309 Allergic rhinitis, unspecified: Secondary | ICD-10-CM | POA: Diagnosis not present

## 2022-12-29 ENCOUNTER — Ambulatory Visit: Payer: BC Managed Care – PPO

## 2022-12-29 DIAGNOSIS — J309 Allergic rhinitis, unspecified: Secondary | ICD-10-CM | POA: Diagnosis not present

## 2023-01-08 ENCOUNTER — Other Ambulatory Visit: Payer: Self-pay | Admitting: Physician Assistant

## 2023-01-08 DIAGNOSIS — F411 Generalized anxiety disorder: Secondary | ICD-10-CM

## 2023-01-08 DIAGNOSIS — F429 Obsessive-compulsive disorder, unspecified: Secondary | ICD-10-CM

## 2023-01-08 DIAGNOSIS — F325 Major depressive disorder, single episode, in full remission: Secondary | ICD-10-CM

## 2023-01-10 ENCOUNTER — Encounter: Payer: Self-pay | Admitting: Physician Assistant

## 2023-01-10 ENCOUNTER — Other Ambulatory Visit: Payer: Self-pay | Admitting: Physician Assistant

## 2023-01-10 ENCOUNTER — Ambulatory Visit (INDEPENDENT_AMBULATORY_CARE_PROVIDER_SITE_OTHER): Payer: BC Managed Care – PPO

## 2023-01-10 DIAGNOSIS — F429 Obsessive-compulsive disorder, unspecified: Secondary | ICD-10-CM

## 2023-01-10 DIAGNOSIS — F411 Generalized anxiety disorder: Secondary | ICD-10-CM

## 2023-01-10 DIAGNOSIS — J309 Allergic rhinitis, unspecified: Secondary | ICD-10-CM

## 2023-01-10 DIAGNOSIS — F325 Major depressive disorder, single episode, in full remission: Secondary | ICD-10-CM

## 2023-01-10 MED ORDER — SERTRALINE HCL 50 MG PO TABS
ORAL_TABLET | ORAL | 0 refills | Status: DC
Start: 2023-01-10 — End: 2023-01-31

## 2023-01-17 ENCOUNTER — Ambulatory Visit (INDEPENDENT_AMBULATORY_CARE_PROVIDER_SITE_OTHER): Payer: BC Managed Care – PPO

## 2023-01-17 DIAGNOSIS — J309 Allergic rhinitis, unspecified: Secondary | ICD-10-CM | POA: Diagnosis not present

## 2023-01-24 ENCOUNTER — Encounter: Payer: Self-pay | Admitting: Physician Assistant

## 2023-01-24 MED ORDER — ONDANSETRON 8 MG PO TBDP: 8.0000 mg | ORAL_TABLET | Freq: Three times a day (TID) | ORAL | 0 refills | Status: AC | PRN

## 2023-01-26 ENCOUNTER — Ambulatory Visit: Payer: BC Managed Care – PPO

## 2023-01-26 DIAGNOSIS — J309 Allergic rhinitis, unspecified: Secondary | ICD-10-CM

## 2023-01-29 ENCOUNTER — Other Ambulatory Visit: Payer: Self-pay | Admitting: Physician Assistant

## 2023-01-29 DIAGNOSIS — F411 Generalized anxiety disorder: Secondary | ICD-10-CM

## 2023-01-29 DIAGNOSIS — F325 Major depressive disorder, single episode, in full remission: Secondary | ICD-10-CM

## 2023-01-29 DIAGNOSIS — F429 Obsessive-compulsive disorder, unspecified: Secondary | ICD-10-CM

## 2023-01-31 ENCOUNTER — Ambulatory Visit (INDEPENDENT_AMBULATORY_CARE_PROVIDER_SITE_OTHER): Payer: BC Managed Care – PPO

## 2023-01-31 DIAGNOSIS — J309 Allergic rhinitis, unspecified: Secondary | ICD-10-CM

## 2023-02-09 ENCOUNTER — Ambulatory Visit (INDEPENDENT_AMBULATORY_CARE_PROVIDER_SITE_OTHER): Payer: BC Managed Care – PPO

## 2023-02-09 DIAGNOSIS — J309 Allergic rhinitis, unspecified: Secondary | ICD-10-CM

## 2023-02-13 ENCOUNTER — Encounter: Payer: Self-pay | Admitting: Physician Assistant

## 2023-02-13 ENCOUNTER — Other Ambulatory Visit: Payer: Self-pay | Admitting: Physician Assistant

## 2023-02-13 DIAGNOSIS — F325 Major depressive disorder, single episode, in full remission: Secondary | ICD-10-CM

## 2023-02-13 DIAGNOSIS — F411 Generalized anxiety disorder: Secondary | ICD-10-CM

## 2023-02-13 DIAGNOSIS — F429 Obsessive-compulsive disorder, unspecified: Secondary | ICD-10-CM

## 2023-02-13 NOTE — Telephone Encounter (Signed)
yes

## 2023-02-14 ENCOUNTER — Other Ambulatory Visit: Payer: Self-pay | Admitting: Physician Assistant

## 2023-02-14 DIAGNOSIS — F411 Generalized anxiety disorder: Secondary | ICD-10-CM

## 2023-02-14 DIAGNOSIS — F429 Obsessive-compulsive disorder, unspecified: Secondary | ICD-10-CM

## 2023-02-14 DIAGNOSIS — F325 Major depressive disorder, single episode, in full remission: Secondary | ICD-10-CM

## 2023-02-14 MED ORDER — SERTRALINE HCL 50 MG PO TABS: ORAL_TABLET | ORAL | 0 refills | Status: DC

## 2023-02-14 NOTE — Progress Notes (Signed)
Refill until appt 

## 2023-02-16 ENCOUNTER — Ambulatory Visit: Payer: BC Managed Care – PPO

## 2023-02-16 DIAGNOSIS — J309 Allergic rhinitis, unspecified: Secondary | ICD-10-CM | POA: Diagnosis not present

## 2023-02-20 ENCOUNTER — Encounter: Payer: Self-pay | Admitting: Physician Assistant

## 2023-02-20 ENCOUNTER — Telehealth (INDEPENDENT_AMBULATORY_CARE_PROVIDER_SITE_OTHER): Payer: BC Managed Care – PPO | Admitting: Physician Assistant

## 2023-02-20 VITALS — Ht 64.0 in | Wt 152.0 lb

## 2023-02-20 DIAGNOSIS — F411 Generalized anxiety disorder: Secondary | ICD-10-CM | POA: Diagnosis not present

## 2023-02-20 DIAGNOSIS — M1712 Unilateral primary osteoarthritis, left knee: Secondary | ICD-10-CM

## 2023-02-20 DIAGNOSIS — F429 Obsessive-compulsive disorder, unspecified: Secondary | ICD-10-CM | POA: Diagnosis not present

## 2023-02-20 DIAGNOSIS — Z13220 Encounter for screening for lipoid disorders: Secondary | ICD-10-CM

## 2023-02-20 DIAGNOSIS — R635 Abnormal weight gain: Secondary | ICD-10-CM

## 2023-02-20 DIAGNOSIS — F325 Major depressive disorder, single episode, in full remission: Secondary | ICD-10-CM

## 2023-02-20 DIAGNOSIS — Z131 Encounter for screening for diabetes mellitus: Secondary | ICD-10-CM

## 2023-02-20 DIAGNOSIS — L299 Pruritus, unspecified: Secondary | ICD-10-CM

## 2023-02-20 DIAGNOSIS — E663 Overweight: Secondary | ICD-10-CM

## 2023-02-20 MED ORDER — HYDROXYZINE PAMOATE 25 MG PO CAPS: ORAL_CAPSULE | ORAL | 1 refills | Status: AC

## 2023-02-20 MED ORDER — SERTRALINE HCL 50 MG PO TABS: ORAL_TABLET | ORAL | 3 refills | Status: DC

## 2023-02-20 NOTE — Patient Instructions (Signed)
Slenderrix weight loss drops

## 2023-02-20 NOTE — Progress Notes (Unsigned)
An attempt to reach was made  at 3:33pm unable able to leave vm due to vm being full

## 2023-02-20 NOTE — Progress Notes (Unsigned)
..Virtual Visit via Video Note  I connected with [name] on 02/21/23 at  3:40 PM EST by a video enabled telemedicine application and verified that I am speaking with the correct person using two identifiers.  Location: Patient: home Provider: clinic  .Marland KitchenParticipating in visit:  [name] Provider: Tandy Gaw PA-C   I discussed the limitations of evaluation and management by telemedicine and the availability of in person appointments. The patient expressed understanding and agreed to proceed.  History of Present Illness: Pt is a 47 yo female who presents to the clinic for medication refills for mood. She is doing well with zoloft. No concerns. She is taking medication daily. She uses hydroxyzine as needed for itching.   She is concerned today with her weight. She is limited in her exercise due to her left knee pain. She sees orthopedics and considering partial knee replacement. She wonders if her hormones have anything to do with weight gain. She would like labs.   .. 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   Cervical dysplasia 03/06/2015   Trouble in sleeping 03/06/2015   Menorrhagia 05/18/2015   Depression 12/11/2017   Allergic sinusitis 08/01/2019   Panic attack 11/27/2019   Cold intolerance 11/03/2020   ETD (Eustachian tube dysfunction), left 12/13/2021   Angioedema of lips 05/03/2022   Seasonal and perennial allergic rhinoconjunctivitis 05/03/2022   Irritant contact dermatitis due to food in contact with skin 05/03/2022   Primary osteoarthritis of left knee 02/20/2023   Abnormal weight gain 02/20/2023   Itching 02/20/2023   Resolved Ambulatory Problems    Diagnosis Date Noted   No Resolved Ambulatory Problems   Past Medical History:  Diagnosis Date   Abnormal  Pap smear    Frequent UTI    Hx of varicella    Kidney stones        Observations/Objective: No acute distress Normal mood and appearance Normal breathing  .Marland Kitchen    02/21/2023    7:35 AM 11/17/2021    2:40 PM 11/03/2020   11:38 AM 11/27/2019    8:33 AM 02/19/2019    9:27 AM  Depression screen PHQ 2/9  Decreased Interest 1 1 0 0 0  Down, Depressed, Hopeless 1 1 0 0 0  PHQ - 2 Score 2 2 0 0 0  Altered sleeping 1 2 2 3 1   Tired, decreased energy 1 1 2 1 1   Change in appetite 1 0 0 0 0  Feeling bad or failure about yourself  0 0 0 0 0  Trouble concentrating 0 1 2 0 0  Moving slowly or fidgety/restless 0 0 0 0 0  Suicidal thoughts 0 0 0 0 0  PHQ-9 Score 5 6 6 4 2   Difficult doing work/chores Not difficult at all Somewhat difficult Not difficult at all Not difficult at all Not difficult at all   ..    02/21/2023    7:36 AM 11/17/2021    2:40 PM 11/03/2020   11:40 AM 11/27/2019    8:36 AM  GAD 7 : Generalized Anxiety Score  Nervous, Anxious, on Edge 1 3 2 1   Control/stop worrying 1 3 1 3   Worry too much - different things 1 3 2 3   Trouble relaxing 1 3 1 1   Restless 0 3 2 0  Easily  annoyed or irritable 1 3 2 1   Afraid - awful might happen 1 3 2 3   Total GAD 7 Score 6 21 12 12   Anxiety Difficulty Not difficult at all Somewhat difficult Somewhat difficult Not difficult at all       Assessment and Plan: Marland KitchenMarland KitchenApril was seen today for medical management of chronic issues.  Diagnoses and all orders for this visit:  Primary osteoarthritis of left knee  GAD (generalized anxiety disorder) -     sertraline (ZOLOFT) 50 MG tablet; TAKE ONE AND ONE-HALF TABLET DAILY.  Major depressive disorder in full remission, unspecified whether recurrent (HCC) -     sertraline (ZOLOFT) 50 MG tablet; TAKE ONE AND ONE-HALF TABLET DAILY.  Obsessive-compulsive disorder, unspecified type -     sertraline (ZOLOFT) 50 MG tablet; TAKE ONE AND ONE-HALF TABLET DAILY.  Itching -     hydrOXYzine  (VISTARIL) 25 MG capsule; TAKE ONE TO TWO CAPSULES AS NEEDED FOR ALLERGIC REACTION/SWELLING/ITCHING.  Abnormal weight gain -     TSH + Rieke T4 -     CMP14+EGFR -     CBC w/Diff/Platelet -     Lipid panel -     VITAMIN D 25 Hydroxy (Vit-D Deficiency, Fractures) -     FSH/LH -     Estradiol -     Progesterone -     Fe+TIBC+Fer  Screening for diabetes mellitus -     CMP14+EGFR  Screening for lipid disorders -     Lipid panel  Overweight (BMI 25.0-29.9)   Per patient doing well on zoloft PHQ/GAD improved since last check Refilled today Calculated BMI today of 26.1 which is just overweight Will get labs to make sure nothing is out of range that could help her Will check hormones Encouraged low weight bearing exercise like yoga and pilates for exercise Encouraged to get knee surgery so that she can be active Consider slenderix drops for natural weight loss Follow up in 6 months or sooner if needed  Follow Up Instructions:    I discussed the assessment and treatment plan with the patient. The patient was provided an opportunity to ask questions and all were answered. The patient agreed with the plan and demonstrated an understanding of the instructions.   The patient was advised to call back or seek an in-person evaluation if the symptoms worsen or if the condition fails to improve as anticipated.  Tandy Gaw, PA-C

## 2023-02-21 ENCOUNTER — Ambulatory Visit (INDEPENDENT_AMBULATORY_CARE_PROVIDER_SITE_OTHER): Payer: BC Managed Care – PPO

## 2023-02-21 ENCOUNTER — Encounter: Payer: Self-pay | Admitting: Physician Assistant

## 2023-02-21 DIAGNOSIS — J309 Allergic rhinitis, unspecified: Secondary | ICD-10-CM | POA: Diagnosis not present

## 2023-02-28 ENCOUNTER — Ambulatory Visit (INDEPENDENT_AMBULATORY_CARE_PROVIDER_SITE_OTHER): Payer: BC Managed Care – PPO

## 2023-02-28 DIAGNOSIS — J309 Allergic rhinitis, unspecified: Secondary | ICD-10-CM

## 2023-03-09 ENCOUNTER — Ambulatory Visit: Payer: BC Managed Care – PPO

## 2023-03-09 DIAGNOSIS — J309 Allergic rhinitis, unspecified: Secondary | ICD-10-CM | POA: Diagnosis not present

## 2023-03-16 ENCOUNTER — Ambulatory Visit (INDEPENDENT_AMBULATORY_CARE_PROVIDER_SITE_OTHER): Payer: BC Managed Care – PPO

## 2023-03-16 DIAGNOSIS — J309 Allergic rhinitis, unspecified: Secondary | ICD-10-CM | POA: Diagnosis not present

## 2023-03-19 ENCOUNTER — Encounter: Payer: Self-pay | Admitting: Physician Assistant

## 2023-03-22 MED ORDER — BUPROPION HCL ER (XL) 150 MG PO TB24: ORAL_TABLET | ORAL | 1 refills | Status: DC

## 2023-03-22 NOTE — Addendum Note (Signed)
Addended by: Jomarie Longs on: 03/22/2023 04:07 PM   Modules accepted: Orders

## 2023-03-23 ENCOUNTER — Ambulatory Visit: Payer: BC Managed Care – PPO

## 2023-03-23 DIAGNOSIS — J309 Allergic rhinitis, unspecified: Secondary | ICD-10-CM | POA: Diagnosis not present

## 2023-03-28 ENCOUNTER — Ambulatory Visit (INDEPENDENT_AMBULATORY_CARE_PROVIDER_SITE_OTHER): Payer: BC Managed Care – PPO

## 2023-03-28 DIAGNOSIS — J309 Allergic rhinitis, unspecified: Secondary | ICD-10-CM | POA: Diagnosis not present

## 2023-04-06 ENCOUNTER — Ambulatory Visit: Payer: 59

## 2023-04-06 DIAGNOSIS — J309 Allergic rhinitis, unspecified: Secondary | ICD-10-CM | POA: Diagnosis not present

## 2023-04-13 ENCOUNTER — Ambulatory Visit (INDEPENDENT_AMBULATORY_CARE_PROVIDER_SITE_OTHER): Payer: 59

## 2023-04-13 DIAGNOSIS — J309 Allergic rhinitis, unspecified: Secondary | ICD-10-CM | POA: Diagnosis not present

## 2023-04-15 ENCOUNTER — Other Ambulatory Visit: Payer: Self-pay | Admitting: Physician Assistant

## 2023-04-25 ENCOUNTER — Ambulatory Visit (INDEPENDENT_AMBULATORY_CARE_PROVIDER_SITE_OTHER): Payer: 59

## 2023-04-25 DIAGNOSIS — J309 Allergic rhinitis, unspecified: Secondary | ICD-10-CM

## 2023-05-16 IMAGING — MG MM DIGITAL DIAGNOSTIC UNILAT*R* W/ TOMO W/ CAD
6 series · 6 of 18 positions shown · non-contrast
Comparison: Previous exam(s).

CLINICAL DATA: Palpable mass in the RIGHT breast for 1 month.
History of cysts.

EXAM:
DIGITAL DIAGNOSTIC UNILATERAL RIGHT MAMMOGRAM WITH TOMOSYNTHESIS AND
CAD; ULTRASOUND RIGHT BREAST LIMITED
TECHNIQUE: Right digital diagnostic mammography and breast tomosynthesis was
performed. The images were evaluated with computer-aided detection.;
Targeted ultrasound examination of the right breast was performed

[R CC synth-2D]
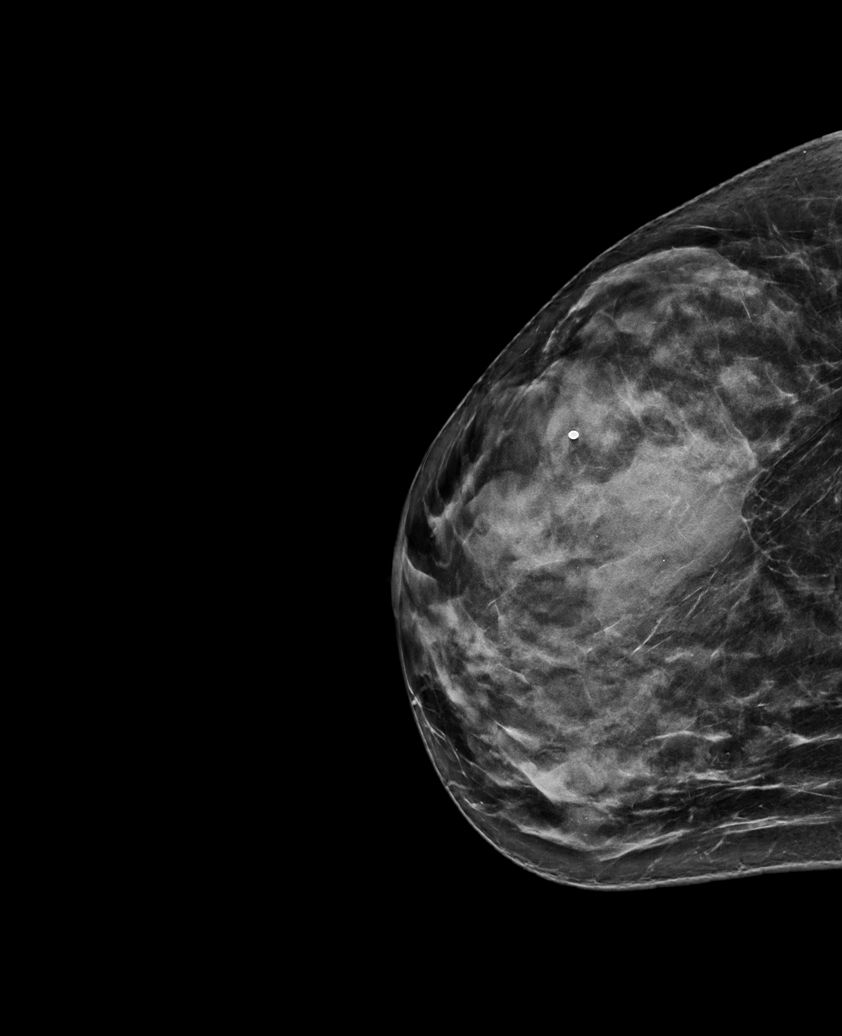

[R TAN synth-2D]
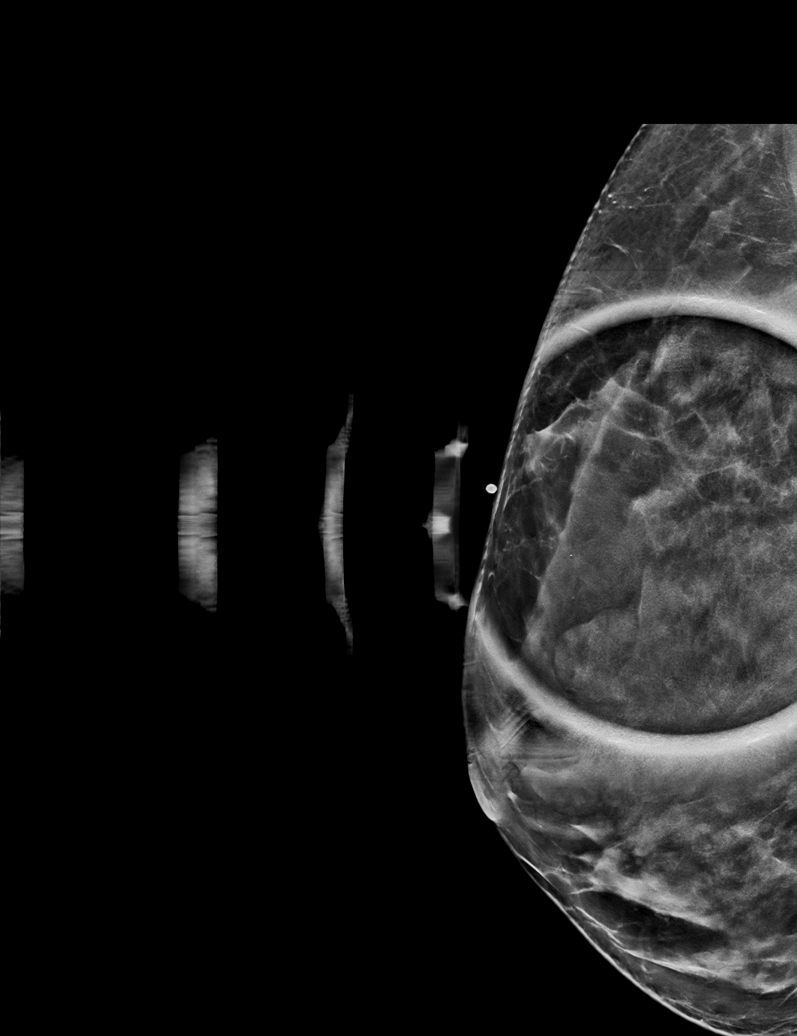

[R MLO synth-2D]
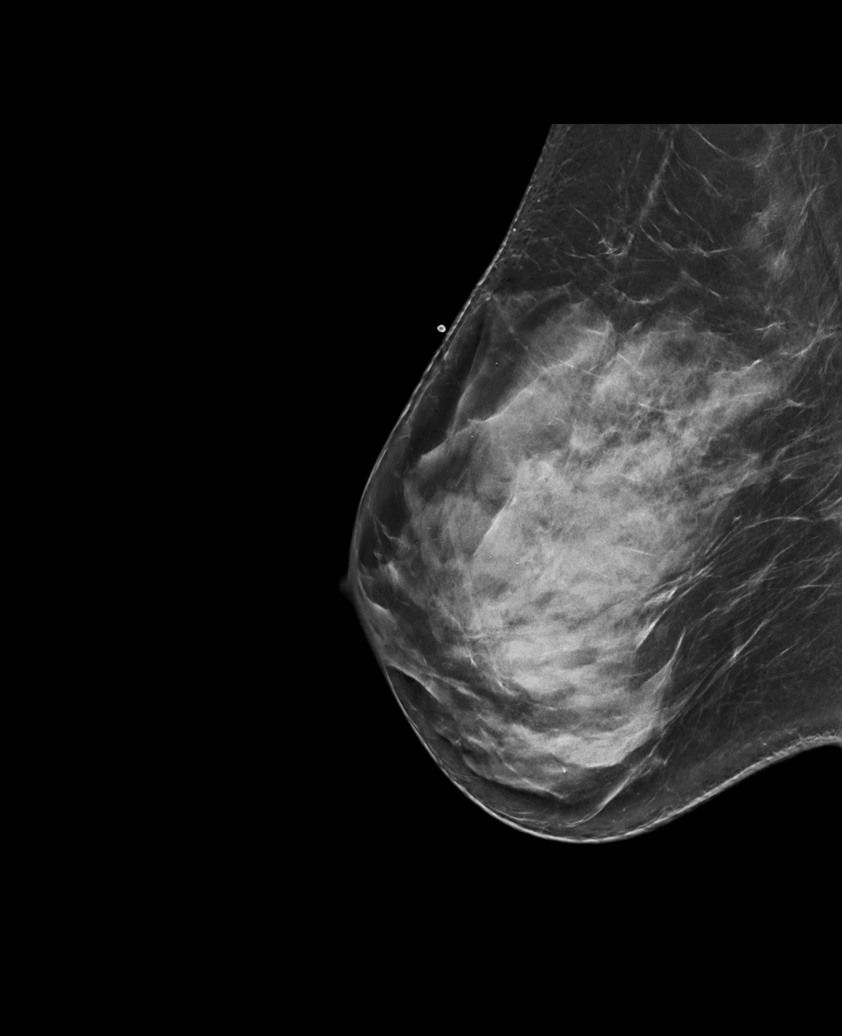

[R MLO tomo · tomo slice 41/82.0]
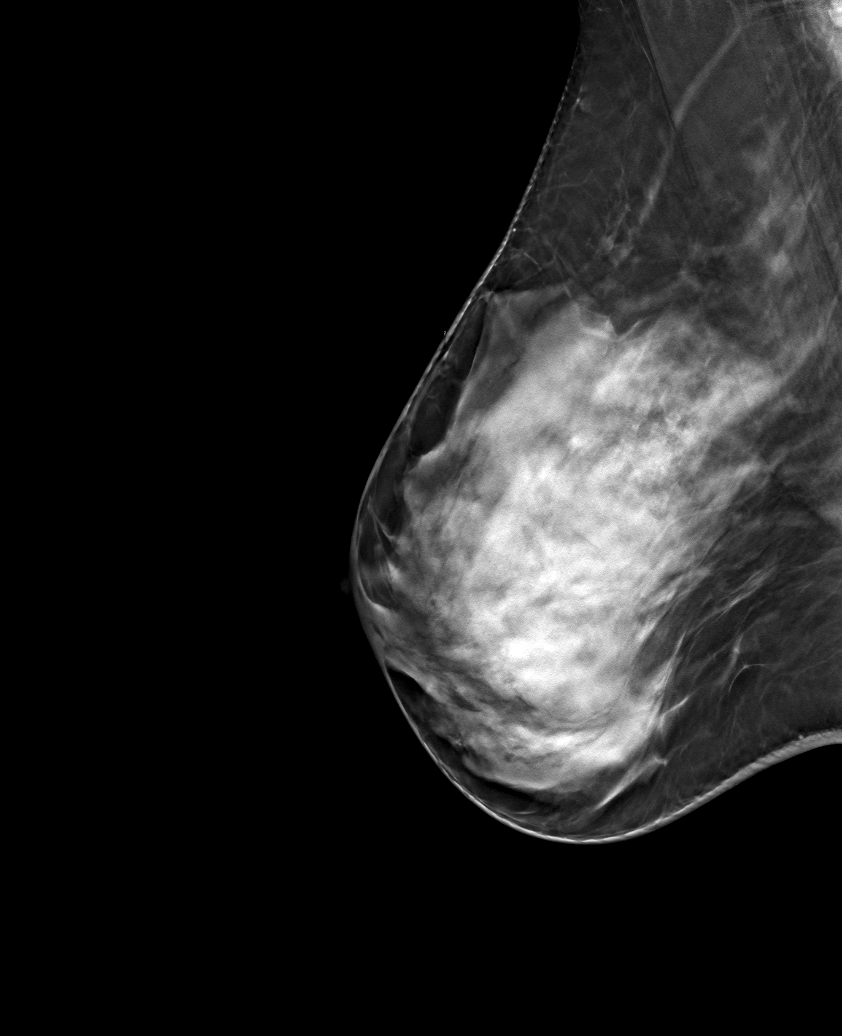

[R CC tomo · tomo slice 36/71.0]
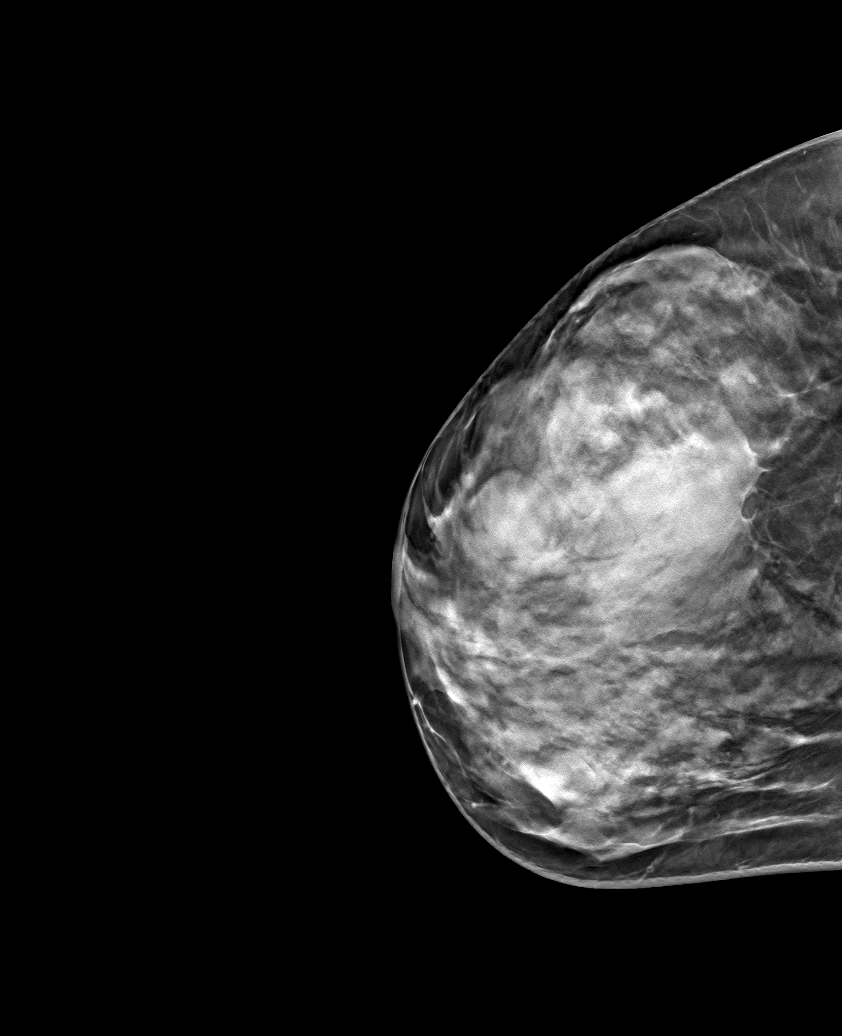

[R TAN tomo · tomo slice 41/80.0]
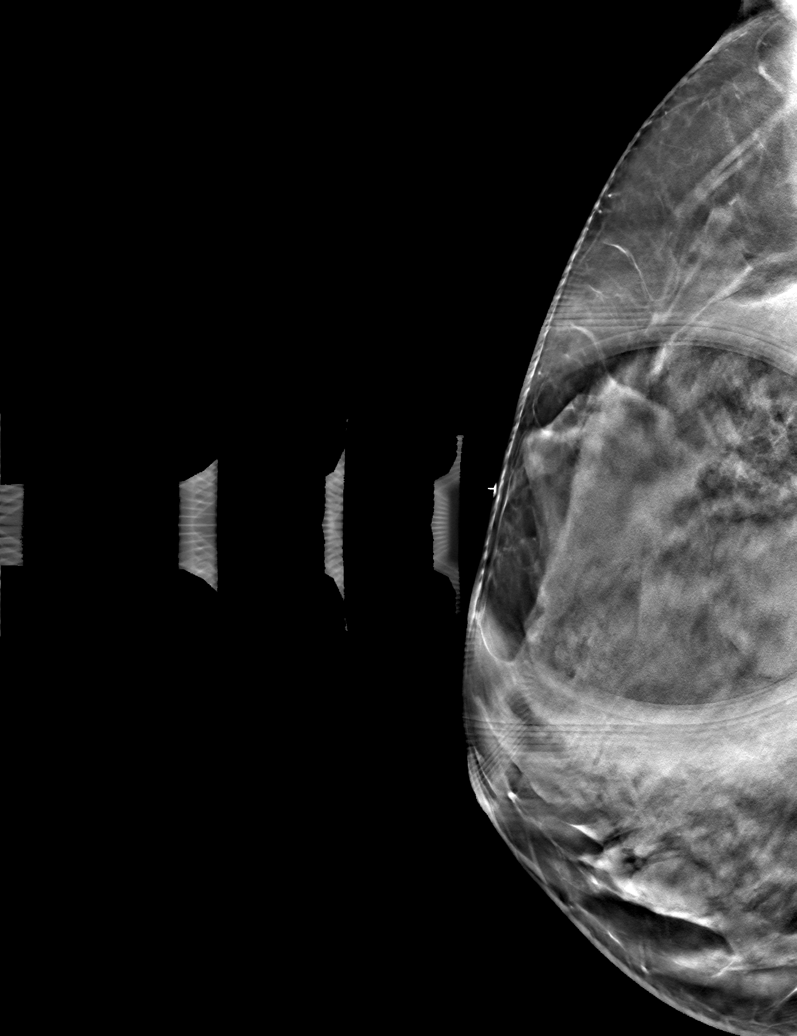

[6 of 18 positions shown; findings below may reference images not displayed]

ACR Breast Density Category d: The breast tissue is extremely dense,
which lowers the sensitivity of mammography.
FINDINGS: Numerous circumscribed oval masses are again identified throughout
the RIGHT breast. A BB marks the area of concern, showing dense
fibroglandular tissue and partially obscured masses in the
UPPER-OUTER QUADRANT of the RIGHT breast.

On physical exam, I palpate a superficial oval smooth mobile mass in
the 11 o'clock location of the RIGHT breast 5 centimeters from the
nipple.

Targeted ultrasound is performed, showing a simple cyst in the 11
o'clock location 5 centimeters from the nipple measuring 0.8 x 0.2 x
0.5 centimeters. Other cysts are also identified in the UPPER-OUTER
QUADRANT of the RIGHT breast, measuring up to 1.5 centimeters in
diameter. No solid masses or areas of acoustic shadowing.
IMPRESSION: Palpable abnormality in the RIGHT breast represents a benign cysts.
No mammographic or ultrasound evidence for malignancy.

RECOMMENDATION:
Screening mammogram is recommended in November 2021.

I have discussed the findings and recommendations with the patient.
If applicable, a reminder letter will be sent to the patient
regarding the next appointment.

BI-RADS CATEGORY  2: Benign.

## 2023-06-01 ENCOUNTER — Ambulatory Visit (INDEPENDENT_AMBULATORY_CARE_PROVIDER_SITE_OTHER): Payer: 59

## 2023-06-01 DIAGNOSIS — J309 Allergic rhinitis, unspecified: Secondary | ICD-10-CM | POA: Diagnosis not present

## 2023-06-06 ENCOUNTER — Ambulatory Visit (INDEPENDENT_AMBULATORY_CARE_PROVIDER_SITE_OTHER)

## 2023-06-06 DIAGNOSIS — J309 Allergic rhinitis, unspecified: Secondary | ICD-10-CM

## 2023-06-13 ENCOUNTER — Ambulatory Visit (INDEPENDENT_AMBULATORY_CARE_PROVIDER_SITE_OTHER)

## 2023-06-13 DIAGNOSIS — J309 Allergic rhinitis, unspecified: Secondary | ICD-10-CM | POA: Diagnosis not present

## 2023-06-14 DIAGNOSIS — J3089 Other allergic rhinitis: Secondary | ICD-10-CM | POA: Diagnosis not present

## 2023-06-14 NOTE — Progress Notes (Signed)
 VIALS MADE 06-14-23. EXP 06-13-24

## 2023-06-20 ENCOUNTER — Ambulatory Visit (INDEPENDENT_AMBULATORY_CARE_PROVIDER_SITE_OTHER)

## 2023-06-20 DIAGNOSIS — J309 Allergic rhinitis, unspecified: Secondary | ICD-10-CM | POA: Diagnosis not present

## 2023-06-27 ENCOUNTER — Ambulatory Visit (INDEPENDENT_AMBULATORY_CARE_PROVIDER_SITE_OTHER)

## 2023-06-27 DIAGNOSIS — J309 Allergic rhinitis, unspecified: Secondary | ICD-10-CM | POA: Diagnosis not present

## 2023-07-04 ENCOUNTER — Ambulatory Visit (INDEPENDENT_AMBULATORY_CARE_PROVIDER_SITE_OTHER)

## 2023-07-04 DIAGNOSIS — J309 Allergic rhinitis, unspecified: Secondary | ICD-10-CM | POA: Diagnosis not present

## 2023-07-11 ENCOUNTER — Ambulatory Visit (INDEPENDENT_AMBULATORY_CARE_PROVIDER_SITE_OTHER)

## 2023-07-11 DIAGNOSIS — J309 Allergic rhinitis, unspecified: Secondary | ICD-10-CM

## 2023-07-27 ENCOUNTER — Ambulatory Visit

## 2023-07-27 DIAGNOSIS — J309 Allergic rhinitis, unspecified: Secondary | ICD-10-CM

## 2023-08-03 ENCOUNTER — Ambulatory Visit (INDEPENDENT_AMBULATORY_CARE_PROVIDER_SITE_OTHER)

## 2023-08-03 ENCOUNTER — Encounter: Payer: Self-pay | Admitting: Physician Assistant

## 2023-08-03 DIAGNOSIS — J309 Allergic rhinitis, unspecified: Secondary | ICD-10-CM | POA: Diagnosis not present

## 2023-08-10 ENCOUNTER — Ambulatory Visit

## 2023-08-10 DIAGNOSIS — J309 Allergic rhinitis, unspecified: Secondary | ICD-10-CM

## 2023-08-24 ENCOUNTER — Ambulatory Visit (INDEPENDENT_AMBULATORY_CARE_PROVIDER_SITE_OTHER)

## 2023-08-24 DIAGNOSIS — J309 Allergic rhinitis, unspecified: Secondary | ICD-10-CM | POA: Diagnosis not present

## 2023-09-05 ENCOUNTER — Ambulatory Visit (INDEPENDENT_AMBULATORY_CARE_PROVIDER_SITE_OTHER)

## 2023-09-05 DIAGNOSIS — J309 Allergic rhinitis, unspecified: Secondary | ICD-10-CM

## 2023-09-21 ENCOUNTER — Ambulatory Visit (INDEPENDENT_AMBULATORY_CARE_PROVIDER_SITE_OTHER)

## 2023-09-21 DIAGNOSIS — J309 Allergic rhinitis, unspecified: Secondary | ICD-10-CM | POA: Diagnosis not present

## 2023-09-26 ENCOUNTER — Ambulatory Visit (INDEPENDENT_AMBULATORY_CARE_PROVIDER_SITE_OTHER): Admitting: Urgent Care

## 2023-09-26 ENCOUNTER — Encounter: Payer: Self-pay | Admitting: Physician Assistant

## 2023-09-26 ENCOUNTER — Encounter: Payer: Self-pay | Admitting: Urgent Care

## 2023-09-26 ENCOUNTER — Ambulatory Visit: Payer: Self-pay

## 2023-09-26 VITALS — BP 131/89 | HR 76 | Resp 18 | Ht 64.5 in | Wt 155.8 lb

## 2023-09-26 DIAGNOSIS — Z87442 Personal history of urinary calculi: Secondary | ICD-10-CM | POA: Diagnosis not present

## 2023-09-26 DIAGNOSIS — R109 Unspecified abdominal pain: Secondary | ICD-10-CM | POA: Diagnosis not present

## 2023-09-26 DIAGNOSIS — R1012 Left upper quadrant pain: Secondary | ICD-10-CM

## 2023-09-26 DIAGNOSIS — N200 Calculus of kidney: Secondary | ICD-10-CM

## 2023-09-26 LAB — POCT URINALYSIS DIP (CLINITEK)
Bilirubin, UA: NEGATIVE
Glucose, UA: NEGATIVE mg/dL
Ketones, POC UA: NEGATIVE mg/dL
Leukocytes, UA: NEGATIVE
Nitrite, UA: NEGATIVE
POC PROTEIN,UA: NEGATIVE
Spec Grav, UA: 1.02 (ref 1.010–1.025)
Urobilinogen, UA: 0.2 U/dL
pH, UA: 6 (ref 5.0–8.0)

## 2023-09-26 MED ORDER — KETOROLAC TROMETHAMINE 60 MG/2ML IM SOLN
60.0000 mg | Freq: Once | INTRAMUSCULAR | Status: AC
  Administered 2023-09-26: 60 mg via INTRAMUSCULAR

## 2023-09-26 MED ORDER — TAMSULOSIN HCL 0.4 MG PO CAPS: 0.4000 mg | ORAL_CAPSULE | Freq: Every day | ORAL | 0 refills | Status: AC

## 2023-09-26 MED ORDER — KETOROLAC TROMETHAMINE 10 MG PO TABS: 10.0000 mg | ORAL_TABLET | Freq: Four times a day (QID) | ORAL | 0 refills | Status: AC | PRN

## 2023-09-26 NOTE — Patient Instructions (Addendum)
 You were given an injection of Toradol in office.  Please take the oral toradol starting tomorrow every 6 hours with food. Do NOT exceed 4 days of use. INCREASE WATER!! Goal is 2-3L daily. Take flomax  once daily to help facilitate stone passage  I have ordered a STAT CT scan - I would get the performed tomorrow only if symptoms do not improve/ resolve to ensure the stone is not stuck. If needed, call imaging department to schedule. Phone: [phone]  If you develop worsening pain, fever, chills or gross hematuria, please head to the ER.

## 2023-09-26 NOTE — Progress Notes (Unsigned)
 Established Patient Office Visit  Subjective:  Patient ID: [name], female    DOB: 1976/02/11  Age: 48 y.o. MRN: [id number]  Chief Complaint  Patient presents with   Nephrolithiasis    Pt states it is very painful. Pt did take 3 200 mg motrin  before her appt    HPI  Discussed the use of AI scribe software for clinical note transcription with the patient, who gave verbal consent to proceed.  History of Present Illness   [name] is a 48 year old female with a history of kidney stones who presents with acute left-sided flank pain. She is accompanied by a family member who drove her to the hospital.  She has been experiencing significant left-sided flank pain that began yesterday [place]. The pain is described as deep and localized to the left side, accompanied by a sensation of needing to urinate frequently but producing little urine. She attempted to drive to the nearest hospital but was unable to due to severe pain and nausea.  Her history of kidney stones dates back to the age of [age], with the last episode occurring in 2021. She has experienced stones getting stuck in the past and has undergone procedures to remove them, including the placement and removal of stents.  She has not been on any preventative medication for kidney stones and has not followed up with urology since her last episode. Her fluid intake is low, consuming only about half a bottle of water daily and occasionally coffee. She attributes her condition to hereditary factors and possibly diet.  In the past 24 hours, she has taken Motrin  for pain relief, with the last dose being three tablets at 10 PM. She experienced vomiting due to pain yesterday but reports no fever or chills. No visible blood in her urine. Her family member notes that she was sweating profusely during the episode yesterday and that she attempted to seek medical attention but was unable to be seen due to hospital  capacity issues. She does note some improvement today.     Patient Active Problem List   Diagnosis Date Noted   Primary osteoarthritis of left knee 02/20/2023   Abnormal weight gain 02/20/2023   Itching 02/20/2023   Angioedema of lips 05/03/2022   Seasonal and perennial allergic rhinoconjunctivitis 05/03/2022   Irritant contact dermatitis due to food in contact with skin 05/03/2022   ETD (Eustachian tube dysfunction), left 12/13/2021   Cold intolerance 11/03/2020   Panic attack 11/27/2019   Allergic sinusitis 08/01/2019   Depression 12/11/2017   Menorrhagia 05/18/2015   History of HPV infection 03/06/2015   Cervical dysplasia 03/06/2015   Trouble in sleeping 03/06/2015   GAD (generalized anxiety disorder) 02/03/2015   OCD (obsessive compulsive disorder) 02/03/2015   Chest pain 10/16/2013   Hyperlipidemia 10/16/2013   Past Medical History:  Diagnosis Date   Abnormal Pap smear    Depression    pp   Frequent UTI    Hx of varicella    Hyperlipidemia    Kidney stones    Past Surgical History:  Procedure Laterality Date   DILATION AND CURETTAGE OF UTERUS     HYSTERECTOMY ABDOMINAL WITH SALPINGECTOMY     KIDNEY STONE SURGERY     LEEP     Social History   Tobacco Use   Smoking status: Former   Smokeless tobacco: Never  Vaping Use   Vaping status: Never Used  Substance Use Topics   Alcohol use:  Yes    Comment: Occasional   Drug use: No      ROS: as noted in HPI  Objective:     BP 131/89 (BP Location: Left Arm, Patient Position: Sitting, Cuff Size: Normal)   Pulse 76   Resp 18   Ht 5' 4.5 (1.638 m)   Wt 155 lb 12 oz (70.6 kg)   SpO2 100%   BMI 26.32 kg/m  BP Readings from Last 3 Encounters:  09/26/23 131/89  11/01/22 108/84  10/05/22 116/84   Wt Readings from Last 3 Encounters:  09/26/23 155 lb 12 oz (70.6 kg)  02/21/23 152 lb (68.9 kg)  11/01/22 161 lb (73 kg)      Physical Exam Vitals and nursing note reviewed. Exam conducted with a chaperone  present.  Constitutional:      Appearance: Normal appearance. She is not ill-appearing or diaphoretic.     Comments: Pt uncomfortable, leaning forward at waist  HENT:     Mouth/Throat:     Mouth: Mucous membranes are moist.   Eyes:     General: No scleral icterus.       Right eye: No discharge.        Left eye: No discharge.    Cardiovascular:     Rate and Rhythm: Normal rate.  Pulmonary:     Effort: Pulmonary effort is normal. No respiratory distress.  Abdominal:     Tenderness: There is left CVA tenderness. There is no right CVA tenderness or rebound.   Skin:    General: Skin is warm and dry.     Coloration: Skin is not jaundiced.     Findings: No bruising, erythema or rash.   Neurological:     General: No focal deficit present.     Mental Status: She is alert and oriented to person, place, and time.   Psychiatric:        Mood and Affect: Mood normal.        Behavior: Behavior normal.      Results for orders placed or performed in visit on 09/26/23  POCT URINALYSIS DIP (CLINITEK)  Result Value Ref Range   Color, UA yellow yellow   Clarity, UA cloudy (A) clear   Glucose, UA negative negative mg/dL   Bilirubin, UA negative negative   Ketones, POC UA negative negative mg/dL   Spec Grav, UA 8.979 8.989 - 1.025   Blood, UA large (A) negative   pH, UA 6.0 5.0 - 8.0   POC PROTEIN,UA negative negative, trace   Urobilinogen, UA 0.2 0.2 or 1.0 E.U./dL   Nitrite, UA Negative Negative   Leukocytes, UA Negative Negative    Last CBC Lab Results  Component Value Date   WBC 11.5 (H) 05/05/2012   HGB 11.4 (L) 05/05/2012   HCT 34.5 (L) 05/05/2012   MCV 89.1 05/05/2012   MCH 29.5 05/05/2012   RDW 14.9 05/05/2012   PLT 229 05/05/2012   Last metabolic panel Lab Results  Component Value Date   GLUCOSE 80 04/11/2012   NA 134 (L) 04/11/2012   K 3.7 04/11/2012   CL 101 04/11/2012   CO2 20 04/11/2012   BUN 6 04/11/2012   CREATININE 0.61 04/11/2012   CALCIUM 9.0  04/11/2012   PROT 6.4 04/11/2012   ALBUMIN 2.8 (L) 04/11/2012   BILITOT 0.3 04/11/2012   ALKPHOS 95 04/11/2012   AST 12 04/11/2012   ALT 8 04/11/2012      The ASCVD Risk score (Arnett DK, et al., 2019) failed  to calculate for the following reasons:   Cannot find a previous HDL lab   Cannot find a previous total cholesterol lab  Assessment & Plan:  Flank pain -     POCT URINALYSIS DIP (CLINITEK) -     Ketorolac Tromethamine; Take 1 tablet (10 mg total) by mouth every 6 (six) hours as needed for severe pain (pain score 7-10). Take with food. Do not exceed 4 days of use  Dispense: 16 tablet; Refill: 0 -     Tamsulosin  HCl; Take 1 capsule (0.4 mg total) by mouth daily.  Dispense: 30 capsule; Refill: 0 -     CT RENAL STONE STUDY; Future -     Ketorolac Tromethamine  History of kidney stones -     Ketorolac Tromethamine; Take 1 tablet (10 mg total) by mouth every 6 (six) hours as needed for severe pain (pain score 7-10). Take with food. Do not exceed 4 days of use  Dispense: 16 tablet; Refill: 0 -     Tamsulosin  HCl; Take 1 capsule (0.4 mg total) by mouth daily.  Dispense: 30 capsule; Refill: 0 -     CT RENAL STONE STUDY; Future -     Ketorolac Tromethamine  Left upper quadrant abdominal pain -     Ketorolac Tromethamine  Assessment and Plan    Nephrolithiasis Recurrent calcium oxalate stones with acute episode. Pain and nausea has decreased significantly since yesterday. One episode of vomiting yesterday due to pain, none today. Hereditary predisposition and low fluid intake likely contributing. No current urology follow-up. - Administer ketorolac injection for pain relief and stone passage. - Prescribe oral ketorolac, 16 pills, no refills. Do not exceed 4 days use - Prescribe tamsulosin  to aid stone passage. - Encourage increased fluid intake, preferably bottled water. - Order CT scan if no symptom improvement in 24 hours or gross hematuria occurs. - Urine dip does not reveal  signs of infection - Discuss potential urology follow-up if symptoms persist or recur.  Dehydration Low fluid intake contributing to nephrolithiasis. Currently drinking <16oz water daily. - Encourage increased fluid intake, suggest flavored water to improve hydration.       No follow-ups on file.   Benton LITTIE Gave, PA

## 2023-09-26 NOTE — Telephone Encounter (Signed)
 Copied from CRM [id number]. Topic: Clinical - Red Word Triage >> Sep 26, 2023  5:28 PM Mercer PEDLAR wrote: Red Word that prompted transfer to Nurse Triage: was seen today for kidney stones and given injection but is in pain and feeling nauseas.    FYI Only or Action Required?: FYI only for provider.  Patient was last seen in primary care on 09/26/2023 by Lowella Benton CROME, PA. Called Nurse Triage reporting Flank Pain. Symptoms began yesterday. Interventions attempted: Prescription medications: Toradol injection today. Symptoms are: unchanged.  Triage Disposition: See Physician Within 24 Hours if symptoms do not improve with home care advice.   Patient/caregiver understands and will follow disposition?:     Reason for Disposition  MODERATE pain (e.g., interferes with normal activities or awakens from sleep)    Patient seen in the office today. Patient has not taken Tylenol  yet. Patient will take tylenol  and if pain does not improve or if it worsens I have advised her to go to UC or the ED. Patient understood and is agreeable with this plan.  Answer Assessment - Initial Assessment Questions Patient would like to know if there is anything else that could be prescribed for her pain and her nausea.     1. LOCATION: Where does it hurt? (e.g., left, right)     Left  2. ONSET: When did the pain start?     Yesterday  3. SEVERITY: How bad is the pain? (e.g., Scale 1-10; mild, moderate, or severe)   - MILD (1-3): doesn't interfere with normal activities    - MODERATE (4-7): interferes with normal activities or awakens from sleep    - SEVERE (8-10): excruciating pain and patient unable to do normal activities (stays in bed)       8/10 4. PATTERN: Does the pain come and go, or is it constant?      Constant  5. CAUSE: What do you think is causing the pain?     Kidney stones 6. OTHER SYMPTOMS:  Do you have any other symptoms? (e.g., fever, abdomen pain, vomiting, leg weakness, burning with  urination, blood in urine)     Nausea  Protocols used: Flank Pain-A-AH

## 2023-09-27 ENCOUNTER — Other Ambulatory Visit: Payer: Self-pay | Admitting: Urgent Care

## 2023-09-27 ENCOUNTER — Encounter: Payer: Self-pay | Admitting: Urgent Care

## 2023-09-27 MED ORDER — HYDROCODONE-ACETAMINOPHEN 5-325 MG PO TABS: 1.0000 | ORAL_TABLET | Freq: Four times a day (QID) | ORAL | 0 refills | Status: DC | PRN

## 2023-09-27 NOTE — Addendum Note (Signed)
 Addended by: Armilda Vanderlinden on: 09/27/2023 12:21 AM   Modules accepted: Orders

## 2023-09-27 NOTE — Telephone Encounter (Signed)
 Please notify pt that I have called in something stronger if the ketorlac and flomax  has not helped. I would also recommend she get the CT scan performed today if the pain is not improving.

## 2023-09-27 NOTE — Progress Notes (Signed)
 Rx for norco attempted to be e-scribed last evening, however error in submission noted therefore new Rx sent this morning.

## 2023-09-27 NOTE — Telephone Encounter (Signed)
 Unable to reach patient.  Mailbox is full.

## 2023-10-02 NOTE — Telephone Encounter (Signed)
 Spoke with patient . States she is better. Has been able to manage the pain with extra strength Tylenol . She does not think she has passed the stone yet but symptoms are better. She has not had need for CT per patient.

## 2023-10-04 DIAGNOSIS — J3089 Other allergic rhinitis: Secondary | ICD-10-CM | POA: Diagnosis not present

## 2023-10-04 NOTE — Progress Notes (Signed)
 VIALS MADE 10-04-23

## 2023-10-05 ENCOUNTER — Ambulatory Visit (INDEPENDENT_AMBULATORY_CARE_PROVIDER_SITE_OTHER)

## 2023-10-05 DIAGNOSIS — J309 Allergic rhinitis, unspecified: Secondary | ICD-10-CM

## 2023-10-09 DIAGNOSIS — J301 Allergic rhinitis due to pollen: Secondary | ICD-10-CM | POA: Diagnosis not present

## 2023-10-11 ENCOUNTER — Ambulatory Visit: Admitting: Physician Assistant

## 2023-10-26 ENCOUNTER — Ambulatory Visit (INDEPENDENT_AMBULATORY_CARE_PROVIDER_SITE_OTHER)

## 2023-10-26 DIAGNOSIS — J309 Allergic rhinitis, unspecified: Secondary | ICD-10-CM | POA: Diagnosis not present

## 2023-10-30 NOTE — Progress Notes (Deleted)
 Follow Up Note  RE: [name] MRN: [id number] DOB: 1976-03-02 Date of Office Visit: 10/31/2023  Referring provider: Antoniette Vermell LITTIE DEVONNA Primary care provider: Antoniette Vermell LITTIE, PA-C  Chief Complaint: No chief complaint on file.  History of Present Illness: I had the pleasure of seeing [name] for a follow up visit at the [facility] on 10/31/2023. She is a 48 y.o. female, who is being followed for allergic rhinoconjunctivitis on AIT and dermatitis due to food. Her previous allergy  office visit was on 11/01/2022 with Dr. Luke. Today is a regular follow up visit.  Discussed the use of AI scribe software for clinical note transcription with the patient, who gave verbal consent to proceed.  History of Present Illness            ***  Assessment and Plan: [name] is a 48 y.o. female with: Seasonal and perennial allergic rhinoconjunctivitis Past history - 2024 skin testing showed: Positive to grass, ragweed, weed, trees, mold, dust mites, cat and cockroach. Started AIT on 07/12/22 (m-[id number] & g-[id number]). Interim history - some localized reactions with AIT.  Continue environmental control measures.  Continue Singulair  (montelukast ) 10mg  daily at night. Use over the counter antihistamines such as Zyrtec (cetirizine), Claritin (loratadine), Allegra (fexofenadine), or Xyzal  (levocetirizine) daily as needed. May take twice a day during allergy  flares. May switch antihistamines every few months. Continue allergy  injections - given today.  Carry epinephrine  on the days of your injections.    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 Occurred after eating doritos, cinnamon toast crunch cereal, lasagna and cinnamon coffee creamer which are all foods she had before with no issues. Symptoms resolve within 1 hour. 2024 skin testing showed:  Negative to select foods.  Interim history - tolerates spaghetti and certain cinnamon with no issues.  Keep track of episodes Avoid foods that seem to be irritating to your skin.  Assessment and Plan              No follow-ups on file.  No orders of the defined types were placed in this encounter.  Lab Orders  No laboratory test(s) ordered today    Diagnostics: Spirometry:  Tracings reviewed. Her effort: {Blank single:19197::Good reproducible efforts.,It was hard to get consistent efforts and there is a question as to whether this reflects a maximal maneuver.,Poor effort, data can not be interpreted.} FVC: ***L FEV1: ***L, ***% predicted FEV1/FVC ratio: ***% Interpretation: {Blank single:19197::Spirometry consistent with mild obstructive disease,Spirometry consistent with moderate obstructive disease,Spirometry consistent with severe obstructive disease,Spirometry consistent with possible restrictive disease,Spirometry consistent with mixed obstructive and restrictive disease,Spirometry uninterpretable due to technique,Spirometry consistent with normal pattern,No overt abnormalities noted given today's efforts}.  Please see scanned spirometry results for details.  Skin Testing: {Blank single:19197::Select foods,Environmental allergy  panel,Environmental allergy  panel and select foods,Food allergy  panel,None,Deferred due to recent antihistamines use}. *** Results discussed with patient/family.   Medication List:  Current Outpatient Medications  Medication Sig Dispense Refill   buPROPion  (WELLBUTRIN  XL) 150 MG 24 hr tablet TAKE 1 TABLET BY MOUTH EVERY MORNING 90 tablet 1   EPINEPHrine  (EPIPEN  2-PAK) 0.3 mg/0.3 mL IJ SOAJ injection Inject 0.3 mg into the muscle as needed for anaphylaxis. 1 each 1   HYDROcodone -acetaminophen  (NORCO/VICODIN) 5-325 MG tablet Take 1 tablet by mouth every 6 (six) hours as needed for severe pain (pain  score 7-10). 12  tablet 0   hydrOXYzine  (VISTARIL ) 25 MG capsule TAKE ONE TO TWO CAPSULES AS NEEDED FOR ALLERGIC REACTION/SWELLING/ITCHING. 180 capsule 1   ketorolac  (TORADOL ) 10 MG tablet Take 1 tablet (10 mg total) by mouth every 6 (six) hours as needed for severe pain (pain score 7-10). Take with food. Do not exceed 4 days of use 16 tablet 0   montelukast  (SINGULAIR ) 10 MG tablet Take 1 tablet (10 mg total) by mouth at bedtime. 90 tablet 3   ondansetron  (ZOFRAN -ODT) 8 MG disintegrating tablet Take 1 tablet (8 mg total) by mouth every 8 (eight) hours as needed. 20 tablet 0   sertraline  (ZOLOFT ) 50 MG tablet TAKE ONE AND ONE-HALF TABLET DAILY. 135 tablet 3   tamsulosin  (FLOMAX ) 0.4 MG CAPS capsule Take 1 capsule (0.4 mg total) by mouth daily. 30 capsule 0   No current facility-administered medications for this visit.   Allergies: Allergies  Allergen Reactions   Trazodone  And Nefazodone     Bad nightmares and feeling groggy.   I reviewed her past medical history, social history, family history, and environmental history and no significant changes have been reported from her previous visit.  Review of Systems  Constitutional:  Negative for appetite change, chills, fever and unexpected weight change.  HENT:  Negative for congestion and rhinorrhea.   Eyes:  Negative for itching.  Respiratory:  Negative for cough, chest tightness, shortness of breath and wheezing.   Cardiovascular:  Negative for chest pain.  Gastrointestinal:  Negative for abdominal pain.  Genitourinary:  Negative for difficulty urinating.  Skin:  Negative for rash.  Allergic/Immunologic: Positive for environmental allergies.  Neurological:  Negative for headaches.    Objective: There were no vitals taken for this visit. There is no height or weight on file to calculate BMI. Physical Exam Vitals and nursing note reviewed.  Constitutional:      Appearance: Normal appearance. She is well-developed.  HENT:     Head: Normocephalic and  atraumatic.     Right Ear: Tympanic membrane and external ear normal.     Left Ear: Tympanic membrane and external ear normal.     Nose: Nose normal.     Mouth/Throat:     Mouth: Mucous membranes are moist.     Pharynx: Oropharynx is clear.  Eyes:     Conjunctiva/sclera: Conjunctivae normal.  Cardiovascular:     Rate and Rhythm: Normal rate and regular rhythm.     Heart sounds: Normal heart sounds. No murmur heard.    No friction rub. No gallop.  Pulmonary:     Effort: Pulmonary effort is normal.     Breath sounds: Normal breath sounds. No wheezing, rhonchi or rales.  Musculoskeletal:     Cervical back: Neck supple.  Skin:    General: Skin is warm.     Findings: No rash.  Neurological:     Mental Status: She is alert and oriented to person, place, and time.  Psychiatric:        Behavior: Behavior normal.    Previous notes and tests were reviewed. The plan was reviewed with the patient/family, and all questions/concerned were addressed.  It was my pleasure to see [name] today and participate in her care. Please feel [place] to contact me with any questions or concerns.  Sincerely,  Orlan Cramp, DO Allergy  & Immunology  [facility]  [place] office: [phone] [facility] office: [phone]

## 2023-10-31 ENCOUNTER — Ambulatory Visit: Payer: BC Managed Care – PPO | Admitting: Allergy

## 2023-10-31 DIAGNOSIS — H1010 Acute atopic conjunctivitis, unspecified eye: Secondary | ICD-10-CM

## 2023-10-31 DIAGNOSIS — L246 Irritant contact dermatitis due to food in contact with skin: Secondary | ICD-10-CM

## 2023-10-31 DIAGNOSIS — J309 Allergic rhinitis, unspecified: Secondary | ICD-10-CM

## 2023-11-09 ENCOUNTER — Telehealth: Payer: Self-pay

## 2023-11-09 ENCOUNTER — Ambulatory Visit (INDEPENDENT_AMBULATORY_CARE_PROVIDER_SITE_OTHER)

## 2023-11-09 DIAGNOSIS — J309 Allergic rhinitis, unspecified: Secondary | ICD-10-CM

## 2023-11-09 NOTE — Telephone Encounter (Signed)
 The patient came in for her allergy  injection today. She is on Red slow build up .1,.2,.3,.4,.5 q2wks. She started her red vial December 2024. She had issues along the way with hives. She started [facility] March 2025 and says she has noticed a tremendous amount of improvement. Is she to continue the same schedule? Can she go to every 4 wks with slow build up? Or .1,.3,.5 q4wks. Please advise.

## 2023-11-13 ENCOUNTER — Ambulatory Visit: Admitting: Family Medicine

## 2023-11-13 ENCOUNTER — Encounter: Payer: Self-pay | Admitting: Family Medicine

## 2023-11-13 VITALS — BP 98/66 | Ht 64.0 in | Wt 150.0 lb

## 2023-11-13 DIAGNOSIS — M25562 Pain in left knee: Secondary | ICD-10-CM | POA: Diagnosis not present

## 2023-11-13 DIAGNOSIS — G8929 Other chronic pain: Secondary | ICD-10-CM

## 2023-11-13 MED ORDER — NITROGLYCERIN 0.2 MG/HR TD PT24: MEDICATED_PATCH | TRANSDERMAL | 1 refills | Status: AC

## 2023-11-13 NOTE — Telephone Encounter (Signed)
 She can go every 4 weeks with 0.1, 0.3, 05.   She is also due for her annual OV.   Thank you.

## 2023-11-13 NOTE — Progress Notes (Signed)
 PCP: Antoniette Vermell CROME, PA-C  Subjective:   HPI: Patient is a 48 y.o. female here for evaluation of left knee pain.  She states that the left knee pain has been present for roughly 1.5 years and is located towards the anterior aspect of her knee and that she will have pain in the posterior knee when walking uphill. Her pain is worse with activity or if she has been standing for long periods of time. She has been treating at home with ice, elevation, a compression sleeve, and occasionally Motrin  or Tylenol . She has also been doing PT and has been seen at [facility] where she has received 3 cortisone shots in the knee, each of which have been helpful. However, her most recent injection only lasted 5 months.   Past Medical History:  Diagnosis Date   Abnormal Pap smear    Depression    pp   Frequent UTI    Hx of varicella    Hyperlipidemia    Kidney stones     Current Outpatient Medications on File Prior to Visit  Medication Sig Dispense Refill   buPROPion  (WELLBUTRIN  XL) 150 MG 24 hr tablet TAKE 1 TABLET BY MOUTH EVERY MORNING 90 tablet 1   EPINEPHrine  (EPIPEN  2-PAK) 0.3 mg/0.3 mL IJ SOAJ injection Inject 0.3 mg into the muscle as needed for anaphylaxis. 1 each 1   HYDROcodone -acetaminophen  (NORCO/VICODIN) 5-325 MG tablet Take 1 tablet by mouth every 6 (six) hours as needed for severe pain (pain score 7-10). 12 tablet 0   hydrOXYzine  (VISTARIL ) 25 MG capsule TAKE ONE TO TWO CAPSULES AS NEEDED FOR ALLERGIC REACTION/SWELLING/ITCHING. 180 capsule 1   ketorolac  (TORADOL ) 10 MG tablet Take 1 tablet (10 mg total) by mouth every 6 (six) hours as needed for severe pain (pain score 7-10). Take with food. Do not exceed 4 days of use 16 tablet 0   montelukast  (SINGULAIR ) 10 MG tablet Take 1 tablet (10 mg total) by mouth at bedtime. 90 tablet 3   ondansetron  (ZOFRAN -ODT) 8 MG disintegrating tablet Take 1 tablet (8 mg total) by mouth every 8 (eight) hours as needed. 20 tablet 0   sertraline  (ZOLOFT ) 50  MG tablet TAKE ONE AND ONE-HALF TABLET DAILY. 135 tablet 3   tamsulosin  (FLOMAX ) 0.4 MG CAPS capsule Take 1 capsule (0.4 mg total) by mouth daily. 30 capsule 0   No current facility-administered medications on file prior to visit.    Past Surgical History:  Procedure Laterality Date   DILATION AND CURETTAGE OF UTERUS     HYSTERECTOMY ABDOMINAL WITH SALPINGECTOMY     KIDNEY STONE SURGERY     LEEP      Allergies  Allergen Reactions   Trazodone  And Nefazodone     Bad nightmares and feeling groggy.    BP 98/66   Ht 5' 4 (1.626 m)   Wt 150 lb (68 kg)   BMI 25.75 kg/m       No data to display              No data to display              Objective:  Physical Exam:  Gen: NAD, comfortable in exam room  MSK: Left knee  Inspection: There is mild swelling of the left knee compared to the right, no bony abnormalities appreciated  Palpation: Tender to palpation along the patellar tendon, no tenderness to palpation of the joint line, quadricepts tendon, patella, or posterior knee  ROM: FROM with knee flexion/extension, no  varus/valgus ligamentous laxity appreciated  Strength: 4/5 with knee flexion/extension, dorsi/plantar flexion of ankle, and hip flexion, limited due to pain Neuro/Vasc: Neurovascularly intact distally  Special Tests: Lachman negative, anterior/posterior drawer negative, McMurphy test negative, Apley compression test positive, patellar grind and apprehension test negative   08/31/2022 MRI of Left Knee: Revealed patella alta, lateral patellar tilt, and superolateral Hoffa's fat pad edema consistent with patellar tendon-lateral femoral condyle friction syndrome. Moderate diffuse thinning of cartilage in the inferior 50% of patellar lateral facet noted with subchondral cyst and additional focal cartilage fissure in the patellar median ridge.   Ultrasound Left Knee: There is a mild collection of fluid in the suprapatellar pouch and a mild hypoechoic area within  the patellar tendon. No tear appreciated.    Assessment & Plan:  1. Chronic left knee pain - Suspect patient's left knee pain to be due to a combination of patellar tendonitis and osteoarthritis given her tenderness along the patellar tendon on exam, findings on ultrasound today, and previous improvement in symptoms with cortisone injections. Discussed different options for treatment and at this time patient has opted to treat with Nitroglycerin  patches and home PT exercises. Discussed with patient that shock wave therapy could also be considered in the future. In terms of treatment next steps, recommended patient pursue gel injections, rather than continued cortisone injections due to the risk of increased cartilage degeneration. Lastly, discussed that she can continue activities as tolerated but that she should limit activities that may further aggravate her patellar tendonitis. Advised patient to follow-up in 6 weeks.   - 1/4 Nitroglycerin  patch to affected knee daily   - Home PT exercises to strengthen quadriceps and offload pressure on knee - Consider Aleve for pain as needed   - Consider knee sleeve or patellar tendon strap with activity for added support - Continue activity as tolerated, however, avoid deep squats, lunges, leg press, and plyometrics  - Recommend gel injections, instead of steroid injection in terms of next step in treatment - Follow-up in 6 weeks

## 2023-11-13 NOTE — Patient Instructions (Addendum)
 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 Start nitroglycerin  patches 1/4th patch to affected knee, change daily. Consider aleve 1-2 tabs with breakfast in short term. Do home exercises daily. Activities as tolerated though likely deep squats, lunges, leg press, plyometrics would aggravate this. Consider sleeve or patellar tendon strap when up and walking around. I would recommend the gel injections as the next step instead of steroid as well. Follow up in 6 weeks.

## 2023-11-14 ENCOUNTER — Ambulatory Visit (INDEPENDENT_AMBULATORY_CARE_PROVIDER_SITE_OTHER)

## 2023-11-14 DIAGNOSIS — J309 Allergic rhinitis, unspecified: Secondary | ICD-10-CM | POA: Diagnosis not present

## 2023-11-14 NOTE — Telephone Encounter (Signed)
 Flow sheet updated

## 2023-11-18 ENCOUNTER — Telehealth: Admitting: Family Medicine

## 2023-11-18 DIAGNOSIS — R300 Dysuria: Secondary | ICD-10-CM

## 2023-11-18 MED ORDER — NITROFURANTOIN MONOHYD MACRO 100 MG PO CAPS: 100.0000 mg | ORAL_CAPSULE | Freq: Two times a day (BID) | ORAL | 0 refills | Status: AC

## 2023-11-18 NOTE — Patient Instructions (Signed)
 [name], thank you for joining [name], PA-C for today's virtual visit.  While this provider is not your primary care provider (PCP), if your PCP is located in our provider database this encounter information will be shared with them immediately following your visit.   A [facility] MyChart account gives you access to today's visit and all your visits, tests, and labs performed at [facility]  click here if you don't have a [facility] MyChart account or go to [email or web address]  Consent: (Patient) [name] provided verbal consent for this virtual visit at the beginning of the encounter.  Current Medications:  Current Outpatient Medications:    nitrofurantoin , macrocrystal-monohydrate, (MACROBID ) 100 MG capsule, Take 1 capsule (100 mg total) by mouth 2 (two) times daily for 5 days., Disp: 10 capsule, Rfl: 0   buPROPion  (WELLBUTRIN  XL) 150 MG 24 hr tablet, TAKE 1 TABLET BY MOUTH EVERY MORNING, Disp: 90 tablet, Rfl: 1   EPINEPHrine  (EPIPEN  2-PAK) 0.3 mg/0.3 mL IJ SOAJ injection, Inject 0.3 mg into the muscle as needed for anaphylaxis., Disp: 1 each, Rfl: 1   HYDROcodone -acetaminophen  (NORCO/VICODIN) 5-325 MG tablet, Take 1 tablet by mouth every 6 (six) hours as needed for severe pain (pain score 7-10)., Disp: 12 tablet, Rfl: 0   hydrOXYzine  (VISTARIL ) 25 MG capsule, TAKE ONE TO TWO CAPSULES AS NEEDED FOR ALLERGIC REACTION/SWELLING/ITCHING., Disp: 180 capsule, Rfl: 1   ketorolac  (TORADOL ) 10 MG tablet, Take 1 tablet (10 mg total) by mouth every 6 (six) hours as needed for severe pain (pain score 7-10). Take with food. Do not exceed 4 days of use, Disp: 16 tablet, Rfl: 0   montelukast  (SINGULAIR ) 10 MG tablet, Take 1 tablet (10 mg total) by mouth at bedtime., Disp: 90 tablet, Rfl: 3   nitroGLYCERIN  (NITRODUR - DOSED IN MG/24 HR) 0.2 mg/hr patch, Apply 1/4th patch to affected knee, change daily, Disp: 30 patch, Rfl: 1   ondansetron  (ZOFRAN -ODT) 8 MG disintegrating  tablet, Take 1 tablet (8 mg total) by mouth every 8 (eight) hours as needed., Disp: 20 tablet, Rfl: 0   sertraline  (ZOLOFT ) 50 MG tablet, TAKE ONE AND ONE-HALF TABLET DAILY., Disp: 135 tablet, Rfl: 3   tamsulosin  (FLOMAX ) 0.4 MG CAPS capsule, Take 1 capsule (0.4 mg total) by mouth daily., Disp: 30 capsule, Rfl: 0   Medications ordered in this encounter:  Meds ordered this encounter  Medications   nitrofurantoin , macrocrystal-monohydrate, (MACROBID ) 100 MG capsule    Sig: Take 1 capsule (100 mg total) by mouth 2 (two) times daily for 5 days.    Dispense:  10 capsule    Refill:  0     *If you need refills on other medications prior to your next appointment, please contact your pharmacy*  Follow-Up: Call back or seek an in-person evaluation if the symptoms worsen or if the condition fails to improve as anticipated.  [facility] [phone]  Other Instructions Urinary Tract Infection, Female A urinary tract infection (UTI) is an infection in your urinary tract. The urinary tract is made up of organs that make, store, and get rid of pee (urine) in your body. These organs include: The kidneys. The ureters. The bladder. The urethra. What are the causes? Most UTIs are caused by germs called bacteria. They may be in or near your genitals. These germs grow and cause swelling in your urinary tract. What increases the risk? You're more likely to get a UTI if: You're a female. The urethra is shorter in  females than in males. You have a soft tube called a catheter that drains your pee. You can't control when you pee or poop. You have trouble peeing because of: A kidney stone. A urinary blockage. A nerve condition that affects your bladder. Not getting enough to drink. You're sexually active. You use a birth control inside your vagina, like spermicide. You're pregnant. You have low levels of the hormone estrogen in your body. You're an older adult. You're also more likely  to get a UTI if you have other health problems. These may include: Diabetes. A weak immune system. Your immune system is your body's defense system. Sickle cell disease. Injury of the spine. What are the signs or symptoms? Symptoms may include: Needing to pee right away. Peeing small amounts often. Pain or burning when you pee. Blood in your pee. Pee that smells bad or odd. Pain in your belly or lower back. You may also: Feel confused. This may be the first symptom in older adults. Vomit. Not feel hungry. Feel tired or easily annoyed. Have a fever or chills. How is this diagnosed? A UTI is diagnosed based on your medical history and an exam. You may also have other tests. These may include: Pee tests. Blood tests. Tests for sexually transmitted infections (STIs). If you've had more than one UTI, you may need to have imaging studies done to find out why you keep getting them. How is this treated? A UTI can be treated by: Taking antibiotics or other medicines. Drinking enough fluid to keep your pee pale yellow. In rare cases, a UTI can cause a very bad condition called sepsis. Sepsis may be treated in the hospital. Follow these instructions at home: Medicines Take your medicines only as told by your health care provider. If you were given antibiotics, take them as told by your provider. Do not stop taking them even if you start to feel better. General instructions Make sure you: Pee often and fully. Do not hold your pee for a long time. Wipe from front to back after you pee or poop. Use each tissue only once when you wipe. Pee after you have sex. Do not douche or use sprays or powders in your genital area. Contact a health care provider if: Your symptoms don't get better after 1-2 days of taking antibiotics. Your symptoms go away and then come back. You have a fever or chills. You vomit or feel like you may vomit. Get help right away if: You have very bad pain in your back  or lower belly. You faint. This information is not intended to replace advice given to you by your health care provider. Make sure you discuss any questions you have with your health care provider. Document Revised: 03/01/2023 Document Reviewed: 06/24/2022 Elsevier Patient Education  [employer].   If you have been instructed to have an in-person evaluation today at a local Urgent Care facility, please use the link below. It will take you to a list of all of our available [facility], including address, phone number and hours of operation. Please do not delay care.  [facility]  If you or a family member do not have a primary care provider, use the link below to schedule a visit and establish care. When you choose a [facility] primary care physician or advanced practice provider, you gain a long-term partner in health. Find a Primary Care Provider  Learn more about [facility]'s in-office and virtual care options:  [facility] - Get Care Now

## 2023-11-18 NOTE — Progress Notes (Signed)
 Virtual Visit Consent   [name], you are scheduled for a virtual visit with a [facility] provider today. Just as with appointments in the office, your consent must be obtained to participate. Your consent will be active for this visit and any virtual visit you may have with one of our providers in the next 365 days. If you have a MyChart account, a copy of this consent can be sent to you electronically.  As this is a virtual visit, video technology does not allow for your provider to perform a traditional examination. This may limit your provider's ability to fully assess your condition. If your provider identifies any concerns that need to be evaluated in person or the need to arrange testing (such as labs, EKG, etc.), we will make arrangements to do so. Although advances in technology are sophisticated, we cannot ensure that it will always work on either your end or our end. If the connection with a video visit is poor, the visit may have to be switched to a telephone visit. With either a video or telephone visit, we are not always able to ensure that we have a secure connection.  By engaging in this virtual visit, you consent to the provision of healthcare and authorize for your insurance to be billed (if applicable) for the services provided during this visit. Depending on your insurance coverage, you may receive a charge related to this service.  I need to obtain your verbal consent now. Are you willing to proceed with your visit today? [name] has provided verbal consent on 11/18/2023 for a virtual visit (video or telephone). [name], [place]  Date: 11/18/2023 12:33 PM   Virtual Visit via Video Note   I, [name], connected with  [name]  ([id number], 16-Jun-1975) on 11/18/23 at 12:30 PM EDT by a video-enabled telemedicine application and verified that I am speaking with the correct person using two identifiers.  Location: Patient: Virtual Visit Location Patient:  Home Provider: Virtual Visit Location Provider: Home Office   I discussed the limitations of evaluation and management by telemedicine and the availability of in person appointments. The patient expressed understanding and agreed to proceed.    History of Present Illness: [name] is a 48 y.o. who identifies as a female who was assigned female at birth, and is being seen today for c/o she is pretty sure she has a uti.  Pt states she woke up this morning with an overwhelming feeling of having to urinate and has been on going all morning long.  Pt denies fever, nausea, blood in urine, low back pain.  Pt states she has a burning sensation.   HPI: HPI  Problems:  Patient Active Problem List   Diagnosis Date Noted   Primary osteoarthritis of left knee 02/20/2023   Abnormal weight gain 02/20/2023   Itching 02/20/2023   Angioedema of lips 05/03/2022   Seasonal and perennial allergic rhinoconjunctivitis 05/03/2022   Irritant contact dermatitis due to food in contact with skin 05/03/2022   ETD (Eustachian tube dysfunction), left 12/13/2021   Cold intolerance 11/03/2020   Panic attack 11/27/2019   Allergic sinusitis 08/01/2019   Depression 12/11/2017   Menorrhagia 05/18/2015   History of HPV infection 03/06/2015   Cervical dysplasia 03/06/2015   Trouble in sleeping 03/06/2015   GAD (generalized anxiety disorder) 02/03/2015   OCD (obsessive compulsive disorder) 02/03/2015   Chest pain 10/16/2013   Hyperlipidemia 10/16/2013    Allergies:  Allergies  Allergen Reactions  Trazodone  And Nefazodone     Bad nightmares and feeling groggy.   Medications:  Current Outpatient Medications:    nitrofurantoin , macrocrystal-monohydrate, (MACROBID ) 100 MG capsule, Take 1 capsule (100 mg total) by mouth 2 (two) times daily for 5 days., Disp: 10 capsule, Rfl: 0   buPROPion  (WELLBUTRIN  XL) 150 MG 24 hr tablet, TAKE 1 TABLET BY MOUTH EVERY MORNING, Disp: 90 tablet, Rfl: 1   EPINEPHrine  (EPIPEN  2-PAK)  0.3 mg/0.3 mL IJ SOAJ injection, Inject 0.3 mg into the muscle as needed for anaphylaxis., Disp: 1 each, Rfl: 1   HYDROcodone -acetaminophen  (NORCO/VICODIN) 5-325 MG tablet, Take 1 tablet by mouth every 6 (six) hours as needed for severe pain (pain score 7-10)., Disp: 12 tablet, Rfl: 0   hydrOXYzine  (VISTARIL ) 25 MG capsule, TAKE ONE TO TWO CAPSULES AS NEEDED FOR ALLERGIC REACTION/SWELLING/ITCHING., Disp: 180 capsule, Rfl: 1   ketorolac  (TORADOL ) 10 MG tablet, Take 1 tablet (10 mg total) by mouth every 6 (six) hours as needed for severe pain (pain score 7-10). Take with food. Do not exceed 4 days of use, Disp: 16 tablet, Rfl: 0   montelukast  (SINGULAIR ) 10 MG tablet, Take 1 tablet (10 mg total) by mouth at bedtime., Disp: 90 tablet, Rfl: 3   nitroGLYCERIN  (NITRODUR - DOSED IN MG/24 HR) 0.2 mg/hr patch, Apply 1/4th patch to affected knee, change daily, Disp: 30 patch, Rfl: 1   ondansetron  (ZOFRAN -ODT) 8 MG disintegrating tablet, Take 1 tablet (8 mg total) by mouth every 8 (eight) hours as needed., Disp: 20 tablet, Rfl: 0   sertraline  (ZOLOFT ) 50 MG tablet, TAKE ONE AND ONE-HALF TABLET DAILY., Disp: 135 tablet, Rfl: 3   tamsulosin  (FLOMAX ) 0.4 MG CAPS capsule, Take 1 capsule (0.4 mg total) by mouth daily., Disp: 30 capsule, Rfl: 0  Observations/Objective: Patient is well-developed, well-nourished in no acute distress.  Resting comfortably at home.  Head is normocephalic, atraumatic.  No labored breathing.  Speech is clear and coherent with logical content.  Patient is alert and oriented at baseline.    Assessment and Plan: 1. Dysuria (Primary) - nitrofurantoin , macrocrystal-monohydrate, (MACROBID ) 100 MG capsule; Take 1 capsule (100 mg total) by mouth 2 (two) times daily for 5 days.  Dispense: 10 capsule; Refill: 0  -Start Macrobid  -Pt was advised to follow up in person if symptoms persist or worsen  Follow Up Instructions: I discussed the assessment and treatment plan with the patient. The  patient was provided an opportunity to ask questions and all were answered. The patient agreed with the plan and demonstrated an understanding of the instructions.  A copy of instructions were sent to the patient via MyChart unless otherwise noted below.    The patient was advised to call back or seek an in-person evaluation if the symptoms worsen or if the condition fails to improve as anticipated.    Megan Mater, PA-C

## 2023-11-29 NOTE — Progress Notes (Deleted)
 Follow Up Note  RE: [name] MRN: [id number] DOB: 02/02/1976 Date of Office Visit: 11/30/2023  Referring provider: Antoniette Vermell Luna DEVONNA Primary care provider: Antoniette Vermell LITTIE, PA-C  Chief Complaint: No chief complaint on file.  History of Present Illness: I had the pleasure of seeing [name] for a follow up visit at [facility] on 11/30/2023. She is a 48 y.o. female, who is being followed for allergic rhinoconjunctivitis on AIT and contact dermatitis. Her previous allergy  office visit was on 11/01/2022 with Dr. Luke. Today is a regular follow up visit.  Discussed the use of AI scribe software for clinical note transcription with the patient, who gave verbal consent to proceed.  History of Present Illness            ***  Assessment and Plan: [name] is a 48 y.o. female with: Seasonal and perennial allergic rhinoconjunctivitis Past history - 2024 skin testing showed: Positive to grass, ragweed, weed, trees, mold, dust mites, cat and cockroach. Started AIT on 07/12/22 ([id number] & [id number]). Interim history - some localized reactions with AIT.  Continue environmental control measures.  Continue Singulair  (montelukast ) 10mg  daily at night. Use over the counter antihistamines such as Zyrtec (cetirizine), Claritin (loratadine), Allegra (fexofenadine), or Xyzal  (levocetirizine) daily as needed. May take twice a day during allergy  flares. May switch antihistamines every few months. Continue allergy  injections - given today.  Carry epinephrine  on the days of your injections.    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 Occurred after eating doritos, cinnamon toast crunch cereal, lasagna and cinnamon coffee creamer which are all foods she had before with no issues. Symptoms resolve within 1 hour. 2024 skin testing showed:  Negative to select foods.  Interim history - tolerates spaghetti and certain cinnamon with no issues.  Keep track of episodes Avoid foods that seem to be irritating to your skin.  Assessment and Plan              No follow-ups on file.  No orders of the defined types were placed in this encounter.  Lab Orders  No laboratory test(s) ordered today    Diagnostics: Spirometry:  Tracings reviewed. Her effort: {Blank single:19197::Good reproducible efforts.,It was hard to get consistent efforts and there is a question as to whether this reflects a maximal maneuver.,Poor effort, data can not be interpreted.} FVC: ***L FEV1: ***L, ***% predicted FEV1/FVC ratio: ***% Interpretation: {Blank single:19197::Spirometry consistent with mild obstructive disease,Spirometry consistent with moderate obstructive disease,Spirometry consistent with severe obstructive disease,Spirometry consistent with possible restrictive disease,Spirometry consistent with mixed obstructive and restrictive disease,Spirometry uninterpretable due to technique,Spirometry consistent with normal pattern,No overt abnormalities noted given today's efforts}.  Please see scanned spirometry results for details.  Skin Testing: {Blank single:19197::Select foods,Environmental allergy  panel,Environmental allergy  panel and select foods,Food allergy  panel,None,Deferred due to recent antihistamines use}. *** Results discussed with patient/family.   Medication List:  Current Outpatient Medications  Medication Sig Dispense Refill   buPROPion  (WELLBUTRIN  XL) 150 MG 24 hr tablet TAKE 1 TABLET BY MOUTH EVERY MORNING 90 tablet 1   EPINEPHrine  (EPIPEN  2-PAK) 0.3 mg/0.3 mL IJ SOAJ injection Inject 0.3 mg into the muscle as needed for anaphylaxis. 1 each 1   HYDROcodone -acetaminophen  (NORCO/VICODIN) 5-325 MG tablet Take 1 tablet by mouth every 6 (six) hours as needed for severe pain (pain score 7-10).  12  tablet 0   hydrOXYzine  (VISTARIL ) 25 MG capsule TAKE ONE TO TWO CAPSULES AS NEEDED FOR ALLERGIC REACTION/SWELLING/ITCHING. 180 capsule 1   ketorolac  (TORADOL ) 10 MG tablet Take 1 tablet (10 mg total) by mouth every 6 (six) hours as needed for severe pain (pain score 7-10). Take with food. Do not exceed 4 days of use 16 tablet 0   montelukast  (SINGULAIR ) 10 MG tablet Take 1 tablet (10 mg total) by mouth at bedtime. 90 tablet 3   nitroGLYCERIN  (NITRODUR - DOSED IN MG/24 HR) 0.2 mg/hr patch Apply 1/4th patch to affected knee, change daily 30 patch 1   ondansetron  (ZOFRAN -ODT) 8 MG disintegrating tablet Take 1 tablet (8 mg total) by mouth every 8 (eight) hours as needed. 20 tablet 0   sertraline  (ZOLOFT ) 50 MG tablet TAKE ONE AND ONE-HALF TABLET DAILY. 135 tablet 3   tamsulosin  (FLOMAX ) 0.4 MG CAPS capsule Take 1 capsule (0.4 mg total) by mouth daily. 30 capsule 0   No current facility-administered medications for this visit.   Allergies: Allergies  Allergen Reactions   Trazodone  And Nefazodone     Bad nightmares and feeling groggy.   I reviewed her past medical history, social history, family history, and environmental history and no significant changes have been reported from her previous visit.  Review of Systems  Constitutional:  Negative for appetite change, chills, fever and unexpected weight change.  HENT:  Negative for congestion and rhinorrhea.   Eyes:  Negative for itching.  Respiratory:  Negative for cough, chest tightness, shortness of breath and wheezing.   Cardiovascular:  Negative for chest pain.  Gastrointestinal:  Negative for abdominal pain.  Genitourinary:  Negative for difficulty urinating.  Skin:  Negative for rash.  Allergic/Immunologic: Positive for environmental allergies.  Neurological:  Negative for headaches.    Objective: There were no vitals taken for this visit. There is no height or weight on file to calculate BMI. Physical Exam Vitals and nursing note  reviewed.  Constitutional:      Appearance: Normal appearance. She is well-developed.  HENT:     Head: Normocephalic and atraumatic.     Right Ear: Tympanic membrane and external ear normal.     Left Ear: Tympanic membrane and external ear normal.     Nose: Nose normal.     Mouth/Throat:     Mouth: Mucous membranes are moist.     Pharynx: Oropharynx is clear.  Eyes:     Conjunctiva/sclera: Conjunctivae normal.  Cardiovascular:     Rate and Rhythm: Normal rate and regular rhythm.     Heart sounds: Normal heart sounds. No murmur heard.    No friction rub. No gallop.  Pulmonary:     Effort: Pulmonary effort is normal.     Breath sounds: Normal breath sounds. No wheezing, rhonchi or rales.  Musculoskeletal:     Cervical back: Neck supple.  Skin:    General: Skin is warm.     Findings: No rash.  Neurological:     Mental Status: She is alert and oriented to person, place, and time.  Psychiatric:        Behavior: Behavior normal.    Previous notes and tests were reviewed. The plan was reviewed with the patient/family, and all questions/concerned were addressed.  It was my pleasure to see [name] today and participate in her care. Please feel Dang to contact me with any questions or concerns.  Sincerely,  Orlan Cramp, DO Allergy  & Immunology  [facility]  [place] office: [phone] [facility] office: [phone]

## 2023-11-30 ENCOUNTER — Ambulatory Visit: Admitting: Allergy

## 2023-11-30 ENCOUNTER — Encounter: Payer: Self-pay | Admitting: Allergy

## 2023-11-30 VITALS — BP 124/78 | HR 83 | Temp 98.1°F | Resp 18 | Wt 160.8 lb

## 2023-11-30 DIAGNOSIS — J309 Allergic rhinitis, unspecified: Secondary | ICD-10-CM | POA: Diagnosis not present

## 2023-11-30 DIAGNOSIS — L246 Irritant contact dermatitis due to food in contact with skin: Secondary | ICD-10-CM

## 2023-11-30 DIAGNOSIS — H1010 Acute atopic conjunctivitis, unspecified eye: Secondary | ICD-10-CM

## 2023-11-30 MED ORDER — NEFFY 2 MG/0.1ML NA SOLN: 1.0000 | NASAL | 1 refills | Status: AC | PRN

## 2023-11-30 NOTE — Patient Instructions (Addendum)
 Environmental allergies 2024 skin testing positive to grass, ragweed, weed, trees, mold, dust mites, cat and cockroach. Continue environmental control measures.  Try to wean off antihistamines after the first frost.  Use over the counter antihistamines such as Zyrtec (cetirizine), Claritin (loratadine), Allegra (fexofenadine), or Xyzal  (levocetirizine) daily as needed. May take twice a day during allergy  flares. May switch antihistamines every few months. Continue allergy  injections - given today.  I have prescribed epinephrine  device (Neffy ) and demonstrated proper use. For mild symptoms you can take over the counter antihistamines such as zyrtec 10mg  to 20mg  and monitor symptoms closely. If symptoms worsen or if you have severe symptoms including breathing issues, throat closure, significant swelling, whole body hives, severe diarrhea and vomiting, lightheadedness then spray Neffy  in the nose and seek immediate medical care afterwards. Do not use any nasal sprays for 2 weeks afterwards.  If Neffy  is not covered let me know.  Emergency action plan provided.   Reactions Keep track of episodes Avoid foods that seem to be irritating to your skin.   Follow up in 12 months or sooner if needed.

## 2023-11-30 NOTE — Progress Notes (Signed)
 Follow Up Note  RE: [name] MRN: [id number] DOB: 09-29-1975 Date of Office Visit: 11/30/2023  Referring provider: Antoniette Vermell CROME, PA-C Primary care provider: Antoniette Vermell CROME, PA-C  Chief Complaint: Follow-up (Follow up, doing well, no refills, healthy due for injections today)  History of Present Illness: I had the pleasure of seeing [name] for a follow up visit at the [facility] on 11/30/2023. [name] is a 48 y.o. female, who is being followed for allergic rhinoconjunctivitis on AIT and contact dermatitis. Her previous allergy  office visit was on 11/01/2022 with Dr. Luke. Today is a regular follow up visit.  Discussed the use of AI scribe software for clinical note transcription with the patient, who gave verbal consent to proceed.     [name] has been receiving allergy  shots for over a year with significant improvement in symptoms. After an EpiWash, [name] no longer experiences large localized reactions, enhancing treatment tolerability. [name] describes the impact as 'huge' and did not struggle during the spring season.  [name] takes an over-the-counter allergy  medication daily. [name] has not taken Singulair  for a while and has not noticed a difference without it.  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 There is an air purifier in the classroom. [name] has not had any recent allergic reactions or rash issues, as [name] avoids foods like Doritos and similar chips that previously caused problems.  [name] has an EpiPen  but has never had to use it and is unsure of its current status. [name] has not experienced any new reactions or rash issues recently.     Assessment and Plan: [name] is a 48 y.o. female with: Seasonal and perennial allergic rhinoconjunctivitis Past history - 2024 skin testing positive to grass, ragweed, weed, trees, mold, dust mites, cat and cockroach. Started AIT on 07/12/22 (m-dm-[id number] & g-w-rw-t). Interim history - doing  much better with AIT. Able to wean off singulair  with no worsening symptoms.  Continue environmental control measures.  Try to wean off antihistamines after the first frost.  Use over the counter antihistamines such as Zyrtec (cetirizine), Claritin (loratadine), Allegra (fexofenadine), or Xyzal  (levocetirizine) daily as needed. May take twice a day during allergy  flares. May switch antihistamines every few months. Continue allergy  injections - given today.  I have prescribed epinephrine  device (Neffy ) and demonstrated proper use. For mild symptoms you can take over the counter antihistamines such as zyrtec 10mg  to 20mg  and monitor symptoms closely. If symptoms worsen or if you have severe symptoms including breathing issues, throat closure, significant swelling, whole body hives, severe diarrhea and vomiting, lightheadedness then spray Neffy  in the nose and seek immediate medical care afterwards. Do not use any nasal sprays for 2 weeks afterwards.  If Neffy  is not covered let me know.  Emergency action plan provided.    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 Occurred after eating doritos, cinnamon toast crunch cereal, lasagna and cinnamon coffee creamer which are all foods [name] had before with no issues. Symptoms resolve within 1 hour. 2024 skin testing showed: Negative to select foods.  Interim history - avoiding Doritos, no flares. Keep track of episodes Avoid foods that seem to be irritating to your skin.   Return in about 1 year (around 11/29/2024).  Meds ordered this encounter  Medications   EPINEPHrine  (NEFFY )  2 MG/0.1ML SOLN    Sig: Place 1 Dose into the nose as needed (anaphylactic reaction).    Dispense:  2 each    Refill:  1    Z91.148   Lab Orders  No laboratory test(s) ordered today    Diagnostics: None.   Medication List:  Current  Outpatient Medications  Medication Sig Dispense Refill   buPROPion  (WELLBUTRIN  XL) 150 MG 24 hr tablet TAKE 1 TABLET BY MOUTH EVERY MORNING 90 tablet 1   EPINEPHrine  (EPIPEN  2-PAK) 0.3 mg/0.3 mL IJ SOAJ injection Inject 0.3 mg into the muscle as needed for anaphylaxis. 1 each 1   EPINEPHrine  (NEFFY ) 2 MG/0.1ML SOLN Place 1 Dose into the nose as needed (anaphylactic reaction). 2 each 1   hydrOXYzine  (VISTARIL ) 25 MG capsule TAKE ONE TO TWO CAPSULES AS NEEDED FOR ALLERGIC REACTION/SWELLING/ITCHING. 180 capsule 1   ketorolac  (TORADOL ) 10 MG tablet Take 1 tablet (10 mg total) by mouth every 6 (six) hours as needed for severe pain (pain score 7-10). Take with food. Do not exceed 4 days of use 16 tablet 0   nitroGLYCERIN  (NITRODUR - DOSED IN MG/24 HR) 0.2 mg/hr patch Apply 1/4th patch to affected knee, change daily 30 patch 1   ondansetron  (ZOFRAN -ODT) 8 MG disintegrating tablet Take 1 tablet (8 mg total) by mouth every 8 (eight) hours as needed. 20 tablet 0   sertraline  (ZOLOFT ) 50 MG tablet TAKE ONE AND ONE-HALF TABLET DAILY. 135 tablet 3   tamsulosin  (FLOMAX ) 0.4 MG CAPS capsule Take 1 capsule (0.4 mg total) by mouth daily. 30 capsule 0   HYDROcodone -acetaminophen  (NORCO/VICODIN) 5-325 MG tablet Take 1 tablet by mouth every 6 (six) hours as needed for severe pain (pain score 7-10). 12 tablet 0   No current facility-administered medications for this visit.   Allergies: Allergies  Allergen Reactions   Trazodone  And Nefazodone     Bad nightmares and feeling groggy.   I reviewed her past medical history, social history, family history, and environmental history and no significant changes have been reported from her previous visit.  Review of Systems  Constitutional:  Negative for appetite change, chills, fever and unexpected weight change.  HENT:  Negative for congestion and rhinorrhea.   Eyes:  Negative for itching.  Respiratory:  Negative for cough, chest tightness, shortness of breath and  wheezing.   Cardiovascular:  Negative for chest pain.  Gastrointestinal:  Negative for abdominal pain.  Genitourinary:  Negative for difficulty urinating.  Skin:  Negative for rash.  Allergic/Immunologic: Positive for environmental allergies.  Neurological:  Negative for headaches.    Objective: BP 124/78   Pulse 83   Temp 98.1 F (36.7 C) (Temporal)   Resp 18   Wt 160 lb 12.8 oz (72.9 kg)   SpO2 98%   BMI 27.60 kg/m  Body mass index is 27.6 kg/m. Physical Exam Vitals and nursing note reviewed.  Constitutional:      Appearance: Normal appearance. [name] is well-developed.  HENT:     Head: Normocephalic and atraumatic.     Right Ear: Tympanic membrane and external ear normal.     Left Ear: Tympanic membrane and external ear normal.     Nose: Nose normal.     Mouth/Throat:     Mouth: Mucous membranes are moist.     Pharynx: Oropharynx is clear.  Eyes:     Conjunctiva/sclera: Conjunctivae normal.  Cardiovascular:     Rate and Rhythm: Normal rate and regular rhythm.     Heart sounds: Normal  heart sounds. No murmur heard.    No friction rub. No gallop.  Pulmonary:     Effort: Pulmonary effort is normal.     Breath sounds: Normal breath sounds. No wheezing, rhonchi or rales.  Musculoskeletal:     Cervical back: Neck supple.  Skin:    General: Skin is warm.     Findings: No rash.  Neurological:     Mental Status: [name] is alert and oriented to person, place, and time.  Psychiatric:        Behavior: Behavior normal.    Previous notes and tests were reviewed. The plan was reviewed with the patient/family, and all questions/concerned were addressed.  It was my pleasure to see Kenadee today and participate in her care. Please feel Id[place] to contact me with any questions or concerns.  Sincerely,  Orlan Cramp, DO Allergy  & Immunology  [facility]  [place] office: [phone] [facility] office: [phone]

## 2023-12-05 ENCOUNTER — Ambulatory Visit (INDEPENDENT_AMBULATORY_CARE_PROVIDER_SITE_OTHER)

## 2023-12-05 DIAGNOSIS — J309 Allergic rhinitis, unspecified: Secondary | ICD-10-CM

## 2023-12-08 ENCOUNTER — Telehealth

## 2023-12-08 ENCOUNTER — Encounter: Payer: Self-pay | Admitting: Physician Assistant

## 2023-12-12 ENCOUNTER — Ambulatory Visit (INDEPENDENT_AMBULATORY_CARE_PROVIDER_SITE_OTHER)

## 2023-12-12 DIAGNOSIS — J309 Allergic rhinitis, unspecified: Secondary | ICD-10-CM | POA: Diagnosis not present

## 2023-12-13 ENCOUNTER — Other Ambulatory Visit: Payer: Self-pay

## 2023-12-13 ENCOUNTER — Telehealth: Payer: Self-pay

## 2023-12-13 MED ORDER — EPINEPHRINE 0.3 MG/0.3ML IJ SOAJ: 0.3000 mg | INTRAMUSCULAR | 1 refills | Status: AC | PRN

## 2023-12-13 NOTE — Telephone Encounter (Signed)
 Neffy  denied by insurance. Sent in epinephrine  injectable device to local pharmacy on file.

## 2023-12-13 NOTE — Telephone Encounter (Signed)
*  AA  Pharmacy Patient Advocate Encounter   Received notification from CoverMyMeds that prior authorization for Neffy  2MG /0.1ML solution  is required/requested.   Insurance verification completed.   The patient is insured through [facility] .   Per test claim:  Injectable Epinephrine   is preferred by the insurance.  If suggested medication is appropriate, Please send in a new RX and discontinue this one. If not, please advise as to why it's not appropriate so that we may request a Prior Authorization. Please note, some preferred medications may still require a PA.  If the suggested medications have not been trialed and there are no contraindications to their use, the PA will not be submitted, as it will not be approved.   CMM Key: [id number]

## 2024-01-09 ENCOUNTER — Ambulatory Visit (INDEPENDENT_AMBULATORY_CARE_PROVIDER_SITE_OTHER)

## 2024-01-09 DIAGNOSIS — J309 Allergic rhinitis, unspecified: Secondary | ICD-10-CM | POA: Diagnosis not present

## 2024-02-01 ENCOUNTER — Encounter: Payer: Self-pay | Admitting: Physician Assistant

## 2024-02-02 MED ORDER — PROMETHAZINE-DM 6.25-15 MG/5ML PO SYRP: 5.0000 mL | ORAL_SOLUTION | Freq: Four times a day (QID) | ORAL | 0 refills | Status: AC | PRN

## 2024-02-02 MED ORDER — FLUTICASONE PROPIONATE 50 MCG/ACT NA SUSP: 2.0000 | Freq: Every day | NASAL | 0 refills | Status: AC

## 2024-02-08 ENCOUNTER — Ambulatory Visit (INDEPENDENT_AMBULATORY_CARE_PROVIDER_SITE_OTHER)

## 2024-02-08 DIAGNOSIS — J309 Allergic rhinitis, unspecified: Secondary | ICD-10-CM

## 2024-03-14 ENCOUNTER — Ambulatory Visit (INDEPENDENT_AMBULATORY_CARE_PROVIDER_SITE_OTHER)

## 2024-03-14 DIAGNOSIS — J309 Allergic rhinitis, unspecified: Secondary | ICD-10-CM

## 2024-03-18 ENCOUNTER — Other Ambulatory Visit: Payer: Self-pay | Admitting: Physician Assistant

## 2024-03-18 DIAGNOSIS — F429 Obsessive-compulsive disorder, unspecified: Secondary | ICD-10-CM

## 2024-03-18 DIAGNOSIS — F325 Major depressive disorder, single episode, in full remission: Secondary | ICD-10-CM

## 2024-03-18 DIAGNOSIS — F411 Generalized anxiety disorder: Secondary | ICD-10-CM

## 2024-04-11 ENCOUNTER — Ambulatory Visit (INDEPENDENT_AMBULATORY_CARE_PROVIDER_SITE_OTHER)

## 2024-04-11 DIAGNOSIS — J302 Other seasonal allergic rhinitis: Secondary | ICD-10-CM | POA: Diagnosis not present

## 2024-05-03 NOTE — Progress Notes (Signed)
 VIALS MADE ON 05/03/24
